# Patient Record
Sex: Male | Born: 1970 | Race: Black or African American | Hispanic: No | Marital: Married | Smoking: Former smoker
Health system: Southern US, Community
[De-identification: ages and names within clinical notes are randomized; demographics above are authoritative.]

## PROBLEM LIST (undated history)

## (undated) DIAGNOSIS — M65311 Trigger thumb, right thumb: Secondary | ICD-10-CM

## (undated) DIAGNOSIS — E785 Hyperlipidemia, unspecified: Secondary | ICD-10-CM

## (undated) DIAGNOSIS — R519 Headache, unspecified: Secondary | ICD-10-CM

## (undated) DIAGNOSIS — M79641 Pain in right hand: Secondary | ICD-10-CM

## (undated) DIAGNOSIS — J45909 Unspecified asthma, uncomplicated: Secondary | ICD-10-CM

## (undated) DIAGNOSIS — K219 Gastro-esophageal reflux disease without esophagitis: Secondary | ICD-10-CM

## (undated) DIAGNOSIS — E7800 Pure hypercholesterolemia, unspecified: Secondary | ICD-10-CM

## (undated) DIAGNOSIS — M109 Gout, unspecified: Secondary | ICD-10-CM

## (undated) HISTORY — DX: Gout, unspecified: M10.9

## (undated) HISTORY — DX: Unspecified asthma, uncomplicated: J45.909

## (undated) HISTORY — DX: Trigger thumb, right thumb: M65.311

## (undated) HISTORY — DX: Pure hypercholesterolemia, unspecified: E78.00

## (undated) HISTORY — DX: Headache, unspecified: R51.9

## (undated) HISTORY — DX: Pain in right hand: M79.641

## (undated) HISTORY — DX: Gastro-esophageal reflux disease without esophagitis: K21.9

---

## 1998-12-14 ENCOUNTER — Ambulatory Visit: Admission: RE | Admit: 1998-12-14 | Discharge: 1998-12-14 | Payer: Self-pay | Admitting: Family Medicine

## 1999-03-31 ENCOUNTER — Emergency Department (HOSPITAL_COMMUNITY): Admission: EM | Admit: 1999-03-31 | Discharge: 1999-03-31 | Payer: Self-pay | Admitting: Emergency Medicine

## 1999-04-07 ENCOUNTER — Emergency Department (HOSPITAL_COMMUNITY): Admission: EM | Admit: 1999-04-07 | Discharge: 1999-04-07 | Payer: Self-pay | Admitting: Emergency Medicine

## 2004-07-31 ENCOUNTER — Ambulatory Visit (HOSPITAL_COMMUNITY): Admission: RE | Admit: 2004-07-31 | Discharge: 2004-07-31 | Payer: Self-pay | Admitting: Family Medicine

## 2005-03-20 ENCOUNTER — Encounter: Admission: RE | Admit: 2005-03-20 | Discharge: 2005-03-20 | Payer: Self-pay | Admitting: Family Medicine

## 2006-12-01 HISTORY — PX: BACK SURGERY: SHX140

## 2008-01-04 ENCOUNTER — Emergency Department (HOSPITAL_COMMUNITY): Admission: EM | Admit: 2008-01-04 | Discharge: 2008-01-04 | Payer: Self-pay | Admitting: Emergency Medicine

## 2008-09-26 ENCOUNTER — Encounter: Admission: RE | Admit: 2008-09-26 | Discharge: 2008-09-26 | Payer: Self-pay | Admitting: Sports Medicine

## 2008-10-01 HISTORY — PX: BACK SURGERY: SHX140

## 2008-10-10 ENCOUNTER — Ambulatory Visit (HOSPITAL_COMMUNITY): Admission: RE | Admit: 2008-10-10 | Discharge: 2008-10-11 | Payer: Self-pay | Admitting: Neurosurgery

## 2010-02-13 ENCOUNTER — Emergency Department (HOSPITAL_COMMUNITY): Admission: EM | Admit: 2010-02-13 | Discharge: 2010-02-13 | Payer: Self-pay | Admitting: Emergency Medicine

## 2010-02-27 ENCOUNTER — Encounter: Admission: RE | Admit: 2010-02-27 | Discharge: 2010-02-27 | Payer: Self-pay | Admitting: Otolaryngology

## 2010-12-22 ENCOUNTER — Encounter: Payer: Self-pay | Admitting: Sports Medicine

## 2011-04-15 NOTE — Op Note (Signed)
NAME:  Lee Reynolds, Lee Reynolds                ACCOUNT NO.:  [id number]   MEDICAL RECORD NO.:  [id number]          PATIENT TYPE:  AMB   LOCATION:  SDS                          FACILITY:  [facility]   PHYSICIAN:  Clydene Fake, M.D.  DATE OF BIRTH:  1971/08/13   DATE OF PROCEDURE:  10/10/2008  DATE OF DISCHARGE:                               OPERATIVE REPORT   PREOPERATIVE DIAGNOSES:  Lumbar stenosis, spondylosis, herniated nucleus  pulposus with radiculopathy, L4-L5.   POSTOPERATIVE DIAGNOSES:  Lumbar stenosis, spondylosis, herniated  nucleus pulposus with radiculopathy, L4-L5.   PROCEDURE:  Left L4-L5 decompressive laminectomy and diskectomy,  microdissection with microscope.   SURGEON:  Clydene Fake, MD   ASSISTANT:  Cristi Loron, MD   ANESTHESIA:  General endotracheal tube anesthesia.   ESTIMATED BLOOD LOSS:  Minimal.   BLOOD GIVEN:  None.   DRAINS:  None.   COMPLICATIONS:  None.   REASON FOR PROCEDURE:  The patient is a 40 year old gentleman who has  had back and left leg pain, numbness, urgency of bladder function but no  incontinence, some decreased sensation left S1 distribution, and  decreased left ankle reflex.  An MRI was done showing extremely large  central disk herniation at L4-L5.  He had older components with new free  fragment on top of that causing severe stenosis of the canal.  It is bit  worst to the left than it is to the right.  The patient was brought in  for decompressive laminectomy.   PROCEDURE IN DETAIL:  The patient was brought to the operating room and  general anesthesia was induced.  The patient was placed in a prone  position on a Wilson frame with all pressure points padded.  The patient  was prepped and draped in sterile fashion.  The site of incision  injected with 20 mL of 1% lidocaine with epinephrine.  An incision was  then made in midline, lower lumbar spine.  Incision was taken down to  the fascia.  A needle was placed at the interspace  through the fascia.  X-rays were obtained showing the needle was pointing at the L4-L5  interspace.  Fascia was incised on the left side and subperiosteal  dissection was done with L4 and L5 spinous process lamina out to the  facet.  Self-retaining retractor was placed.  Another marker was placed  here at the interspace.  Another x-ray was obtained confirming our  positioning at L4-L5 level.  Microscope was brought in for  microdissection.  At this point, high-speed drill was used to start semi-  hemilaminectomy and needle facetectomy completed with Kerrison punches.  Ligamentum flavum was removed with Kerrison punches and foraminotomy  done over the L5 root.  We explored the epidural space and found  extremely large disk herniation compressing the nerve root and dura.  We  carefully used a nerve hook and found some free fragments and pulled  these out using nerve root retractor to retract the nerve root and dura  medially and found significant bunch of fragments of disk.  It was  continued removing those.  There was still a large subligamentous disk  herniation component.  The disk space was incised and diskectomy done  with pituitary rongeurs and curettes.  We used osteophyte tools and also  at the endplates.  When we were finished, we had good decompression of  the central canal and the L4 and L5 nerve roots.  Hemostasis was  obtained with bipolar cauterization and Gelfoam and thrombin.  Gelfoam  was irrigated out.  We irrigated with antibiotic solution.  We had very  good hemostasis.  Retractors were removed.  The fascia was closed with 0  Vicryl interrupted sutures.  Subcutaneous tissue closed with 2-0 and 3-0  Vicryl interrupted sutures.  Skin closed with benzoin and Steri-Strips.  A dressing was placed.  The patient was placed back in a supine  position, awoken from anesthesia, and transferred to recovery in stable  condition.           ______________________________  Clydene Fake, M.D.     JRH/MEDQ  D:  10/10/2008  T:  10/11/2008  Job:  [id number]

## 2011-09-02 LAB — APTT: aPTT: 32

## 2011-09-02 LAB — BASIC METABOLIC PANEL
BUN: 15
GFR calc Af Amer: >60
Potassium: 4.4
Sodium: 140

## 2011-09-02 LAB — PROTIME-INR
INR: 0.9
Prothrombin Time: 12.5

## 2011-09-02 LAB — URINALYSIS, ROUTINE W REFLEX MICROSCOPIC
Hgb urine dipstick: NEGATIVE
pH: 5.5

## 2011-09-02 LAB — CBC
Hemoglobin: 15.6
MCHC: 32.6
RBC: 5.53

## 2012-09-23 ENCOUNTER — Emergency Department (INDEPENDENT_AMBULATORY_CARE_PROVIDER_SITE_OTHER)
Admission: EM | Admit: 2012-09-23 | Discharge: 2012-09-23 | Disposition: A | Discharge status: Home / Self Care | Payer: BC Managed Care – PPO | Source: Home / Self Care | Attending: Family Medicine | Admitting: Family Medicine

## 2012-09-23 ENCOUNTER — Encounter (HOSPITAL_COMMUNITY): Payer: Self-pay

## 2012-09-23 DIAGNOSIS — M62830 Muscle spasm of back: Secondary | ICD-10-CM

## 2012-09-23 DIAGNOSIS — M5380 Other specified dorsopathies, site unspecified: Secondary | ICD-10-CM

## 2012-09-23 MED ORDER — CYCLOBENZAPRINE HCL 10 MG PO TABS: 10.0000 mg | ORAL_TABLET | Freq: Two times a day (BID) | ORAL | Status: DC | PRN

## 2012-09-23 MED ORDER — HYDROCODONE-ACETAMINOPHEN 5-325 MG PO TABS: 1.0000 | ORAL_TABLET | Freq: Once | ORAL | Status: DC

## 2012-09-23 MED ORDER — IBUPROFEN 600 MG PO TABS
600.0000 mg | ORAL_TABLET | Freq: Three times a day (TID) | ORAL | Status: DC | PRN
Start: 2012-09-23 — End: 2018-10-01

## 2012-09-23 NOTE — ED Provider Notes (Signed)
History     CSN: [id number]  Arrival date & time 09/23/12  1212   First MD Initiated Contact with Patient 09/23/12 1223      Chief Complaint  Patient presents with  . Back Pain    (Consider location/radiation/quality/duration/timing/severity/associated sxs/prior treatment) HPI Comments: 41 year old male with history of morbid obesity and chronic back pain status post decompression laminectomy of L4 -L5 in 2011. Here complaining of pain in his low back bilateral but worse in the right side since waking up this morning. Denies radiation of the pain to the lower extremities. Denies weakness, paresthesias or numbness in the lower extremities. Patient report he started recently a new exercise routine where he is doing a static bike and lifting weight as well as states he walks. Denies dysuria or hematuria. Denies abdominal pain.   History reviewed. No pertinent past medical history.  Past Surgical History  Procedure Date  . Back surgery 2008    L4,L5    No family history on file.  History  Substance Use Topics  . Smoking status: Not on file  . Smokeless tobacco: Not on file  . Alcohol Use:       Review of Systems  Constitutional: Negative for fever, chills, diaphoresis and fatigue.  Cardiovascular: Negative for leg swelling.  Gastrointestinal: Negative for nausea, abdominal pain, diarrhea and constipation.  Genitourinary: Negative for dysuria and hematuria.  Musculoskeletal: Positive for back pain. Negative for joint swelling.  Neurological: Negative for dizziness, weakness, numbness and headaches.  All other systems reviewed and are negative.    Allergies  Review of patient's allergies indicates no known allergies.  Home Medications   Current Outpatient Rx  Name Route Sig Dispense Refill  . CYCLOBENZAPRINE HCL 10 MG PO TABS Oral Take 1 tablet (10 mg total) by mouth 2 (two) times daily as needed for muscle spasms. 20 tablet 0  . IBUPROFEN 600 MG PO TABS Oral Take 1  tablet (600 mg total) by mouth every 8 (eight) hours as needed for pain. 20 tablet 0    There were no vitals taken for this visit.  Physical Exam  Nursing note and vitals reviewed. Constitutional: He is oriented to person, place, and time. He appears well-developed and well-nourished. No distress.  HENT:  Head: Normocephalic and atraumatic.  Cardiovascular: Normal heart sounds.   Pulmonary/Chest: Breath sounds normal.  Abdominal: Soft. There is no tenderness.       obese  Musculoskeletal:       Spine central: No obvious scoliosis or kyphosis. No pain over bone processes in entire spine.  Vertical small well healed incision in cental lumbar area. Able to bend forward (flexion) 45 degrees and posteriorly (extension) 10 degrees.  Tenderness to palpation and increased tone of lumbar paravertebral muscles bilaterally.  Negative straight leg test ipsy and contralateral.  Lower extremities neurovascularly intact.   Neurological: He is alert and oriented to person, place, and time.  Skin: No rash noted.    ED Course  Procedures (including critical care time)  Labs Reviewed - No data to display No results found.   1. Muscle spasm of back       MDM  Treated with Flexeril and ibuprofen. Back exercises discussed with patient and provided in writing. Asked to followup with primary care provider or his spine specialist if persistent or worsening symptoms despite following treatment.        Sharin Grave, MD 09/24/12 1145

## 2012-09-23 NOTE — ED Notes (Signed)
C/o back pain and spasms, pain started this morning

## 2014-09-26 ENCOUNTER — Other Ambulatory Visit: Payer: Self-pay | Admitting: Rehabilitation

## 2014-09-26 DIAGNOSIS — M5416 Radiculopathy, lumbar region: Secondary | ICD-10-CM

## 2014-10-04 ENCOUNTER — Other Ambulatory Visit: Payer: BC Managed Care – PPO

## 2014-10-11 ENCOUNTER — Ambulatory Visit
Admission: RE | Admit: 2014-10-11 | Discharge: 2014-10-11 | Disposition: A | Discharge status: Home / Self Care | Payer: BC Managed Care – PPO | Source: Ambulatory Visit | Attending: Rehabilitation | Admitting: Rehabilitation

## 2014-10-11 DIAGNOSIS — M5416 Radiculopathy, lumbar region: Secondary | ICD-10-CM

## 2014-10-11 MED ORDER — GADOBENATE DIMEGLUMINE 529 MG/ML IV SOLN
20.0000 mL | Freq: Once | INTRAVENOUS | Status: AC | PRN
  Administered 2014-10-11: 20 mL via INTRAVENOUS

## 2018-06-24 DIAGNOSIS — M79641 Pain in right hand: Secondary | ICD-10-CM

## 2018-06-24 DIAGNOSIS — M65311 Trigger thumb, right thumb: Secondary | ICD-10-CM

## 2018-06-24 HISTORY — DX: Pain in right hand: M79.641

## 2018-06-24 HISTORY — DX: Trigger thumb, right thumb: M65.311

## 2018-09-02 ENCOUNTER — Encounter (HOSPITAL_COMMUNITY): Payer: Self-pay | Admitting: Emergency Medicine

## 2018-09-02 ENCOUNTER — Ambulatory Visit (HOSPITAL_COMMUNITY)
Admission: EM | Admit: 2018-09-02 | Discharge: 2018-09-02 | Disposition: A | Discharge status: Home / Self Care | Payer: No Typology Code available for payment source | Attending: Family Medicine | Admitting: Family Medicine

## 2018-09-02 DIAGNOSIS — R0789 Other chest pain: Secondary | ICD-10-CM | POA: Diagnosis not present

## 2018-09-02 NOTE — Discharge Instructions (Addendum)
I believe that your chest pain was related to acid reflux Your EKG was normal Your exam was normal, heart and lungs sound normal You are feeling better today which is good.  If you develop the pain again with worse signs to include, nausea, sweating, dizziness, fatigue or SOB please go to the ER.

## 2018-09-02 NOTE — ED Triage Notes (Signed)
Pt sts mid sternal CP starting yesterday worse with movement

## 2018-09-02 NOTE — ED Provider Notes (Signed)
[facility]    CSN: [id number] Arrival date & time: 09/02/18  1205     History   Chief Complaint Chief Complaint  Patient presents with  . Chest Pain    HPI Lee Reynolds is a 47 y.o. male.   Pt is a 47 year old male with no significant PMH. He presents with central/epigastric pain that started a few hours after eating some sausage yesterday morning. The pain decreased throughout the night and he was able to sleep. No discomfort today. He took Pepcid yesterday for symptoms with some relief. He does have GERD from time to time. He denies any associated SOB, nausea, diaphoresis. The pain did not radiate. He has not had any recent URI symptoms. No recent travel. No hx of heart disease, PE, DVT. No family hx or ACS. No fever, chills, body aches, fatigue. He does not smoke.   ROS per HPI      History reviewed. No pertinent past medical history.  There are no active problems to display for this patient.   Past Surgical History:  Procedure Laterality Date  . BACK SURGERY  2008   L4,L5       Home Medications    Prior to Admission medications   Medication Sig Start Date End Date Taking? Authorizing Provider  cyclobenzaprine (FLEXERIL) 10 MG tablet Take 1 tablet (10 mg total) by mouth 2 (two) times daily as needed for muscle spasms. Patient not taking: Reported on 09/02/2018 09/23/12   Moreno-Coll, Adlih, MD  ibuprofen (ADVIL,MOTRIN) 600 MG tablet Take 1 tablet (600 mg total) by mouth every 8 (eight) hours as needed for pain. 09/23/12   Moreno-Coll, Adlih, MD    Family History History reviewed. No pertinent family history.  Social History Social History   Tobacco Use  . Smoking status: Not on file  Substance Use Topics  . Alcohol use: Not on file  . Drug use: Not on file     Allergies   Patient has no known allergies.   Review of Systems Review of Systems   Physical Exam Triage Vital Signs ED Triage Vitals [09/02/18 1229]  Enc Vitals Group       BP 126/74     Pulse Rate 69     Resp 16     Temp 98.1 F (36.7 C)     Temp Source Oral     SpO2 100 %     Weight      Height      Head Circumference      Peak Flow      Pain Score      Pain Loc      Pain Edu?      Excl. in GC?    No data found.  Updated Vital Signs BP 126/74 (BP Location: Left Arm)   Pulse 69   Temp 98.1 F (36.7 C) (Oral)   Resp 16   SpO2 100%   Visual Acuity Right Eye Distance:   Left Eye Distance:   Bilateral Distance:    Right Eye Near:   Left Eye Near:    Bilateral Near:     Physical Exam  Constitutional: He is oriented to person, place, and time. He appears well-developed and well-nourished.  Very pleasant. Non toxic or ill appearing.     HENT:  Head: Normocephalic and atraumatic.  Neck: Normal range of motion.  Cardiovascular: Normal rate, regular rhythm and normal pulses. Exam reveals no S3, no S4 and no distant heart sounds.  Pulmonary/Chest: Effort normal and breath sounds normal.  Lungs clear in all fields. No dyspnea or distress. No retractions or nasal flaring.     Abdominal: Soft. There is no tenderness.  Musculoskeletal: Normal range of motion.  Neurological: He is alert and oriented to person, place, and time.  Skin: Skin is warm and dry.  Psychiatric: He has a normal mood and affect.  Nursing note and vitals reviewed.    UC Treatments / Results  Labs (all labs ordered are listed, but only abnormal results are displayed) Labs Reviewed - No data to display  EKG None  Radiology No results found.  Procedures Procedures (including critical care time)  Medications Ordered in UC Medications - No data to display  Initial Impression / Assessment and Plan / UC Course  I have reviewed the triage vital signs and the nursing notes.  Pertinent labs & imaging results that were available during my care of the patient were reviewed by me and considered in my medical decision making (see chart for details).     Exam  normal Pt is not currently having chest pain and feels better No other concerning risk factors, signs or symptoms Less likely ACS, PE.  EKG normal sinus rhythm and normal rate.  More likely this is GERD related. Instructed to follow up for continued or worsening symptoms.  If he develops chest pain again and has concerning symptoms with it he needs to go to the ER.  Final Clinical Impressions(s) / UC Diagnoses   Final diagnoses:  Other chest pain     Discharge Instructions     I believe that your chest pain was related to acid reflux Your EKG was normal Your exam was normal, heart and lungs sound normal You are feeling better today which is good.  If you develop the pain again with worse signs to include, nausea, sweating, dizziness, fatigue or SOB please go to the ER.     ED Prescriptions    None     Controlled Substance Prescriptions [place] Controlled Substance Registry consulted? no   Janace Aris, NP 09/02/18 1330

## 2018-10-01 ENCOUNTER — Ambulatory Visit (HOSPITAL_COMMUNITY)
Admission: EM | Admit: 2018-10-01 | Discharge: 2018-10-01 | Disposition: A | Discharge status: Home / Self Care | Payer: No Typology Code available for payment source | Attending: Family Medicine | Admitting: Family Medicine

## 2018-10-01 ENCOUNTER — Other Ambulatory Visit: Payer: Self-pay

## 2018-10-01 ENCOUNTER — Ambulatory Visit (INDEPENDENT_AMBULATORY_CARE_PROVIDER_SITE_OTHER): Payer: No Typology Code available for payment source

## 2018-10-01 ENCOUNTER — Encounter (HOSPITAL_COMMUNITY): Payer: Self-pay | Admitting: Emergency Medicine

## 2018-10-01 DIAGNOSIS — R0789 Other chest pain: Secondary | ICD-10-CM | POA: Diagnosis not present

## 2018-10-01 DIAGNOSIS — K219 Gastro-esophageal reflux disease without esophagitis: Secondary | ICD-10-CM | POA: Diagnosis not present

## 2018-10-01 HISTORY — DX: Hyperlipidemia, unspecified: E78.5

## 2018-10-01 MED ORDER — LIDOCAINE VISCOUS HCL 2 % MT SOLN
15.0000 mL | Freq: Once | OROMUCOSAL | Status: AC
  Administered 2018-10-01: 15 mL via ORAL

## 2018-10-01 MED ORDER — ALUM & MAG HYDROXIDE-SIMETH 200-200-20 MG/5ML PO SUSP
ORAL | Status: AC
Start: 2018-10-01 — End: 2018-10-01
  Filled 2018-10-01: qty 30

## 2018-10-01 MED ORDER — LIDOCAINE VISCOUS HCL 2 % MT SOLN
OROMUCOSAL | Status: AC
  Filled 2018-10-01: qty 15

## 2018-10-01 MED ORDER — ALUM & MAG HYDROXIDE-SIMETH 200-200-20 MG/5ML PO SUSP
30.0000 mL | Freq: Once | ORAL | Status: AC
  Administered 2018-10-01: 30 mL via ORAL

## 2018-10-01 NOTE — Discharge Instructions (Signed)
I believe your chest pain is from your stomach and esophagus. Take the Prilosec twice a day.  When the pain improves she may go back to once a day. Avoid aspirin, ibuprofen type medicines for the time being.  You may take Tylenol as needed for pain. Follow-up with your family doctor.

## 2018-10-01 NOTE — ED Triage Notes (Signed)
Pt reports continued central chest pressure started over a month ago. Pt was seen here 10/3 with normal EKG. Pt prescribed Prilosec by PCP and taking as prescribed. New onset left arm pain yesterday in upper arm that subsided this morning. Denies associated symptoms. Triage note done by [facility] Student RN

## 2018-10-01 NOTE — ED Provider Notes (Signed)
[facility]    CSN: [id number] Arrival date & time: 10/01/18  0803     History   Chief Complaint Chief Complaint  Patient presents with  . Chest Pain    HPI Lee Reynolds is a 47 y.o. male.   HPI  Patient is here for chest pain.  This is a follow-up from his visit of 09/02/2018.  At that time it was thought his chest pain was more gastrointestinal.  He was referred back to his primary care doctor.  He did see his doctor little over week ago.  She thinks he has acid reflux.  She started him on Prilosec 20 mg once a day.  He has been taking this medication but has not seen improvement.  He has a chest pressure sensation is there all the time.  It does wax and wane somewhat but never completely goes away.  Since yesterday, he has periods of increased pain that radiated into his left upper arm.  With his new symptoms, he wanted further evaluation.  Original EKG was negative.  He has not had any lab work.  Cardiac risk factors include hyperlipidemia, obesity/sedentary.  Negative cigarette smoking, negative hypertension, negative family history. Chest pain is nonexertional.  Chest pain does radiate to left arm.  He has no diaphoresis or shortness of breath.  No underlying lung disease.  No recent cough or infection. Patient denies feeling more stressed.  Past Medical History:  Diagnosis Date  . Hyperlipemia   Gout Presumed GERD  There are no active problems to display for this patient.   Past Surgical History:  Procedure Laterality Date  . BACK SURGERY  2008   L4,L5       Home Medications    Prior to Admission medications   Medication Sig Start Date End Date Taking? Authorizing Provider  allopurinol (ZYLOPRIM) 300 MG tablet TAKE 1 TABLET BY MOUTH ONCE DAILY FOR 90 DAYS 07/15/18  Yes [provider]  atorvastatin (LIPITOR) 20 MG tablet atorvastatin 20 mg tablet  take 1 tablet by mouth once daily   Yes [provider]  omeprazole (PRILOSEC) 20 MG  capsule Take 20 mg by mouth daily. 09/22/18  Yes [provider]    Family History Family History  Problem Relation Age of Onset  . Hyperlipidemia Mother   . Cancer Father     Social History Social History   Tobacco Use  . Smoking status: Current Some Day Smoker    Types: Cigars  . Smokeless tobacco: Never Used  Substance Use Topics  . Alcohol use: Never    Frequency: Never  . Drug use: Never     Allergies   Patient has no known allergies.   Review of Systems Review of Systems  Constitutional: Negative for activity change, appetite change, chills and fever.  HENT: Negative for congestion, dental problem, ear pain and sore throat.   Eyes: Negative for pain and visual disturbance.  Respiratory: Negative for cough and shortness of breath.   Cardiovascular: Positive for chest pain. Negative for palpitations.  Gastrointestinal: Negative for abdominal pain and vomiting.  Genitourinary: Negative for dysuria and hematuria.  Musculoskeletal: Negative for arthralgias and back pain.  Skin: Negative for color change and rash.  Neurological: Negative for seizures and syncope.  Psychiatric/Behavioral: Negative for sleep disturbance. The patient is nervous/anxious.        Chest pain makes him feel mildly anxious  All other systems reviewed and are negative.    Physical Exam Triage Vital Signs  ED Triage Vitals [10/01/18 0828]  Enc Vitals Group     BP (!) 133/91     Pulse Rate 72     Resp 16     Temp 98 F (36.7 C)     Temp Source Oral     SpO2 99 %     Weight      Height      Head Circumference      Peak Flow      Pain Score 1     Pain Loc      Pain Edu?      Excl. in GC?    No data found.  Updated Vital Signs BP 127/85 (BP Location: Left Arm)   Pulse 63   Temp 97.9 F (36.6 C) (Oral)   Resp 18   SpO2 100%      Physical Exam  Constitutional: He appears well-developed and well-nourished. He does not appear ill. No distress.  HENT:  Head:  Normocephalic and atraumatic.  Mouth/Throat: Oropharynx is clear and moist.  Eyes: Pupils are equal, round, and reactive to light. Conjunctivae are normal.  Neck: Normal range of motion. No JVD present.  Cardiovascular: Normal rate and regular rhythm.  No extrasystoles are present. Exam reveals distant heart sounds. Exam reveals no gallop.  No murmur heard. Pulmonary/Chest: Effort normal and breath sounds normal. No respiratory distress.  Abdominal: Soft. Bowel sounds are normal. He exhibits no distension.  No organomegaly.  No epigastric tenderness.  Musculoskeletal: Normal range of motion. He exhibits no edema.       Right lower leg: He exhibits no edema.       Left lower leg: He exhibits no edema.  Lymphadenopathy:    He has no cervical adenopathy.  Neurological: He is alert.  Skin: Skin is warm and dry.  Psychiatric: He has a normal mood and affect. His behavior is normal.     UC Treatments / Results  Labs (all labs ordered are listed, but only abnormal results are displayed) Labs Reviewed - No data to display  EKG None  Radiology Dg Chest 2 View  Result Date: 10/01/2018 CLINICAL DATA:  Chest pain.  Hypertension. EXAM: CHEST - 2 VIEW COMPARISON:  None. FINDINGS: There is atelectatic change in the inferior lingula. Lungs elsewhere clear. Heart size and pulmonary vascularity are normal. No adenopathy. No pneumothorax. No bone lesions. IMPRESSION: Inferior lingular atelectasis. Lungs elsewhere clear. Heart size normal. No adenopathy. Electronically Signed   By: Bretta Bang III M.D.   On: 10/01/2018 09:14    Procedures Procedures (including critical care time)  Medications Ordered in UC Medications  alum & mag hydroxide-simeth (MAALOX/MYLANTA) 200-200-20 MG/5ML suspension 30 mL (30 mLs Oral Given 10/01/18 0929)    And  lidocaine (XYLOCAINE) 2 % viscous mouth solution 15 mL (15 mLs Oral Given 10/01/18 0929)    Initial Impression / Assessment and Plan / UC Course  I  have reviewed the triage vital signs and the nursing notes.  Pertinent labs & imaging results that were available during my care of the patient were reviewed by me and considered in my medical decision making (see chart for details).    Patient got good relief of his chest discomfort from the GI cocktail.  His chest x-ray and EKG are normal.  We discussed that he probably does have acid reflux causing his symptoms.  Since the Prilosec once a day is not helping, he is to increase it to twice a day until symptoms improve and then  go back down to once a day.  He does need to follow-up with his primary care doctor.  He is reminded that if he has acute chest pain, he needs to go to the ER Final Clinical Impressions(s) / UC Diagnoses   Final diagnoses:  Gastroesophageal reflux disease, esophagitis presence not specified  Atypical chest pain     Discharge Instructions     I believe your chest pain is from your stomach and esophagus. Take the Prilosec twice a day.  When the pain improves she may go back to once a day. Avoid aspirin, ibuprofen type medicines for the time being.  You may take Tylenol as needed for pain. Follow-up with your family doctor.    ED Prescriptions    None     Controlled Substance Prescriptions [place] Controlled Substance Registry consulted? No   Eustace Moore, MD 10/01/18 1105

## 2018-10-11 ENCOUNTER — Encounter: Payer: Self-pay | Admitting: Cardiology

## 2018-10-11 ENCOUNTER — Ambulatory Visit (INDEPENDENT_AMBULATORY_CARE_PROVIDER_SITE_OTHER): Payer: No Typology Code available for payment source | Admitting: Cardiology

## 2018-10-11 VITALS — BP 126/70 | HR 74 | Ht 74.0 in | Wt 311.0 lb

## 2018-10-11 DIAGNOSIS — M109 Gout, unspecified: Secondary | ICD-10-CM

## 2018-10-11 DIAGNOSIS — K219 Gastro-esophageal reflux disease without esophagitis: Secondary | ICD-10-CM | POA: Diagnosis not present

## 2018-10-11 DIAGNOSIS — J45909 Unspecified asthma, uncomplicated: Secondary | ICD-10-CM | POA: Diagnosis not present

## 2018-10-11 DIAGNOSIS — E7800 Pure hypercholesterolemia, unspecified: Secondary | ICD-10-CM

## 2018-10-11 DIAGNOSIS — R079 Chest pain, unspecified: Secondary | ICD-10-CM | POA: Diagnosis not present

## 2018-10-11 HISTORY — DX: Pure hypercholesterolemia, unspecified: E78.00

## 2018-10-11 HISTORY — DX: Gout, unspecified: M10.9

## 2018-10-11 HISTORY — DX: Gastro-esophageal reflux disease without esophagitis: K21.9

## 2018-10-11 HISTORY — DX: Unspecified asthma, uncomplicated: J45.909

## 2018-10-11 NOTE — Progress Notes (Signed)
Cardiology Office Note:    Date:  10/11/2018   ID:  Margaretha Seeds, DOB 02-Jan-1971, MRN [id number]  PCP:  Shaune Pollack, MD  Cardiologist:  Garwin Brothers, MD   Referring MD: Shaune Pollack, MD    ASSESSMENT:    1. Chest pain, unspecified type   2. Asthma, unspecified asthma severity, unspecified whether complicated, unspecified whether persistent   3. Gout, unspecified cause, unspecified chronicity, unspecified site   4. Gastroesophageal reflux disease, esophagitis presence not specified   5. Hypercholesterolemia    PLAN:    In order of problems listed above:  1. Primary prevention stressed with the patient.  Importance of compliance with diet and medication stressed and he vocalized understanding.  His blood pressure is stable.  Diet was discussed for dyslipidemia and diabetes mellitus and weight reduction was stressed vocalized understanding.  The risks of obesity explained to the patient. 2. Patient is significantly concerned about his symptoms and wants to begin an exercise program.  In view of multiple risk factors he will undergo exercise stress Cardiolite.  Echocardiogram will be done to assess murmur heard in auscultation.  He knows to go to the nearest emergency room for any significant concerns. 3. Patient will be seen in follow-up appointment in 6 months or earlier if the patient has any concerns    Medication Adjustments/Labs and Tests Ordered: Current medicines are reviewed at length with the patient today.  Concerns regarding medicines are outlined above.  Orders Placed This Encounter  Procedures  . MYOCARDIAL PERFUSION IMAGING  . ECHOCARDIOGRAM COMPLETE   No orders of the defined types were placed in this encounter.    History of Present Illness:    GALEN RUSSMAN is a 47 y.o. male who is being seen today for the evaluation of chest pain at the request of Shaune Pollack, MD.  Patient is a pleasant 47 year old male.  He has past medical history of dyslipidemia  and morbid obesity.  He leads a sedentary lifestyle.  He mentions to me that he is being having substernal chest discomfort not related to exertion.  He went to the emergency room and was evaluated and discharged.  Subsequently is leading a sedentary lifestyle and is very busy with his work.  He wants his own business.  He denies any radiation of the symptoms to the neck or to the arms.  Sexual activity does not bring about the symptoms.  Past Medical History:  Diagnosis Date  . Asthma 10/11/2018  . GERD (gastroesophageal reflux disease) 10/11/2018  . Gout 10/11/2018  . Hypercholesterolemia 10/11/2018  . Hyperlipemia   . Right hand pain 06/24/2018  . Trigger finger of right thumb 06/24/2018    Past Surgical History:  Procedure Laterality Date  . BACK SURGERY  2008   L4,L5    Current Medications: Current Meds  Medication Sig  . Albuterol Sulfate 108 (90 Base) MCG/ACT AEPB Inhale 1 puff into the lungs as needed.  Marland Kitchen allopurinol (ZYLOPRIM) 300 MG tablet TAKE 1 TABLET BY MOUTH ONCE DAILY FOR 90 DAYS  . atorvastatin (LIPITOR) 20 MG tablet atorvastatin 20 mg tablet  take 1 tablet by mouth once daily  . cyclobenzaprine (FLEXERIL) 10 MG tablet Take 1 tablet by mouth 3 (three) times daily as needed.  . indomethacin (INDOCIN) 25 MG capsule Take 1 capsule by mouth 2 (two) times daily.  Marland Kitchen loratadine (CLARITIN) 10 MG tablet Take 10 mg by mouth daily.  Marland Kitchen omeprazole (PRILOSEC) 20 MG capsule Take 20 mg by mouth daily.  Allergies:   Patient has no known allergies.   Social History   Socioeconomic History  . Marital status: Married    Spouse name: Not on file  . Number of children: Not on file  . Years of education: Not on file  . Highest education level: Not on file  Occupational History  . Not on file  Social Needs  . Financial resource strain: Not on file  . Food insecurity:    Worry: Not on file    Inability: Not on file  . Transportation needs:    Medical: Not on file     Non-medical: Not on file  Tobacco Use  . Smoking status: Current Some Day Smoker    Types: Cigars  . Smokeless tobacco: Never Used  Substance and Sexual Activity  . Alcohol use: Never    Frequency: Never  . Drug use: Never  . Sexual activity: Not on file  Lifestyle  . Physical activity:    Days per week: Not on file    Minutes per session: Not on file  . Stress: Not on file  Relationships  . Social connections:    Talks on phone: Not on file    Gets together: Not on file    Attends religious service: Not on file    Active member of club or organization: Not on file    Attends meetings of clubs or organizations: Not on file    Relationship status: Not on file  Other Topics Concern  . Not on file  Social History Narrative  . Not on file     Family History: The patient's family history includes Cancer in his father; Hyperlipidemia in his mother.  ROS:   Please see the history of present illness.    All other systems reviewed and are negative.  EKGs/Labs/Other Studies Reviewed:    The following studies were reviewed today: EKG that he has brought along with him reveals sinus rhythm and nonspecific ST-T changes.   Recent Labs: No results found for requested labs within last 8760 hours.  Recent Lipid Panel No results found for: CHOL, TRIG, HDL, CHOLHDL, VLDL, LDLCALC, LDLDIRECT  Physical Exam:    VS:  BP 126/70 (BP Location: Right Arm, Patient Position: Sitting, Cuff Size: Normal)   Pulse 74   Ht 6\' 2"  (1.88 m)   Wt (!) 311 lb (141.1 kg)   SpO2 98%   BMI 39.93 kg/m     Wt Readings from Last 3 Encounters:  10/11/18 (!) 311 lb (141.1 kg)  10/11/14 (!) 310 lb (140.6 kg)     GEN: Patient is in no acute distress HEENT: Normal NECK: No JVD; No carotid bruits LYMPHATICS: No lymphadenopathy CARDIAC: S1 S2 regular, 2/6 systolic murmur at the apex. RESPIRATORY:  Clear to auscultation without rales, wheezing or rhonchi  ABDOMEN: Soft, non-tender,  non-distended MUSCULOSKELETAL:  No edema; No deformity  SKIN: Warm and dry NEUROLOGIC:  Alert and oriented x 3 PSYCHIATRIC:  Normal affect    Signed, Garwin Brothers, MD  10/11/2018 11:40 AM    [facility]

## 2018-10-11 NOTE — Patient Instructions (Signed)
Medication Instructions:  Your physician recommends that you continue on your current medications as directed. Please refer to the Current Medication list given to you today.  If you need a refill on your cardiac medications before your next appointment, please call your pharmacy.   Lab work: None  If you have labs (blood work) drawn today and your tests are completely normal, you will receive your results only by: Marland Kitchen MyChart Message (if you have MyChart) OR . A paper copy in the mail If you have any lab test that is abnormal or we need to change your treatment, we will call you to review the results.  Testing/Procedures: Your physician has requested that you have an echocardiogram. Echocardiography is a painless test that uses sound waves to create images of your heart. It provides your doctor with information about the size and shape of your heart and how well your heart's chambers and valves are working. This procedure takes approximately one hour. There are no restrictions for this procedure.  Your physician has requested that you have en exercise stress myoview. For further information please visit [email or web address] Please follow instruction sheet, as given.  Follow-Up: At [facility], you and your health needs are our priority.  As part of our continuing mission to provide you with exceptional heart care, we have created designated Provider Care Teams.  These Care Teams include your primary Cardiologist (physician) and Advanced Practice Providers (APPs -  Physician Assistants and Nurse Practitioners) who all work together to provide you with the care you need, when you need it.  You will need a follow up appointment in 6 months.  Please call our office 2 months in advance to schedule this appointment.  You may see another member of our [employer] Provider Team in [place]: Gypsy Balsam, MD . Norman Herrlich, MD  Any Other Special Instructions Will Be Listed Below (If  Applicable).

## 2018-10-18 ENCOUNTER — Ambulatory Visit (HOSPITAL_BASED_OUTPATIENT_CLINIC_OR_DEPARTMENT_OTHER)
Admission: RE | Admit: 2018-10-18 | Discharge: 2018-10-18 | Disposition: A | Discharge status: Home / Self Care | Payer: No Typology Code available for payment source | Source: Ambulatory Visit | Attending: Cardiology | Admitting: Cardiology

## 2018-10-18 DIAGNOSIS — R079 Chest pain, unspecified: Secondary | ICD-10-CM | POA: Diagnosis present

## 2018-10-18 NOTE — Progress Notes (Signed)
*   Echocardiogram 2D Echocardiogram has been performed.   T  10/18/2018, 3:42 PM

## 2018-10-21 ENCOUNTER — Telehealth (HOSPITAL_COMMUNITY): Payer: Self-pay

## 2018-10-21 NOTE — Telephone Encounter (Signed)
Encounter complete. 

## 2018-10-26 ENCOUNTER — Inpatient Hospital Stay (HOSPITAL_COMMUNITY): Admission: RE | Admit: 2018-10-26 | Payer: No Typology Code available for payment source | Source: Ambulatory Visit

## 2018-10-27 ENCOUNTER — Ambulatory Visit (HOSPITAL_COMMUNITY): Payer: No Typology Code available for payment source

## 2019-12-26 IMAGING — DX DG CHEST 2V
2 series · 2 of 2 positions shown · non-contrast
Comparison: None.

CLINICAL DATA: Chest pain.  Hypertension.

EXAM:
CHEST - 2 VIEW

[chest pa]
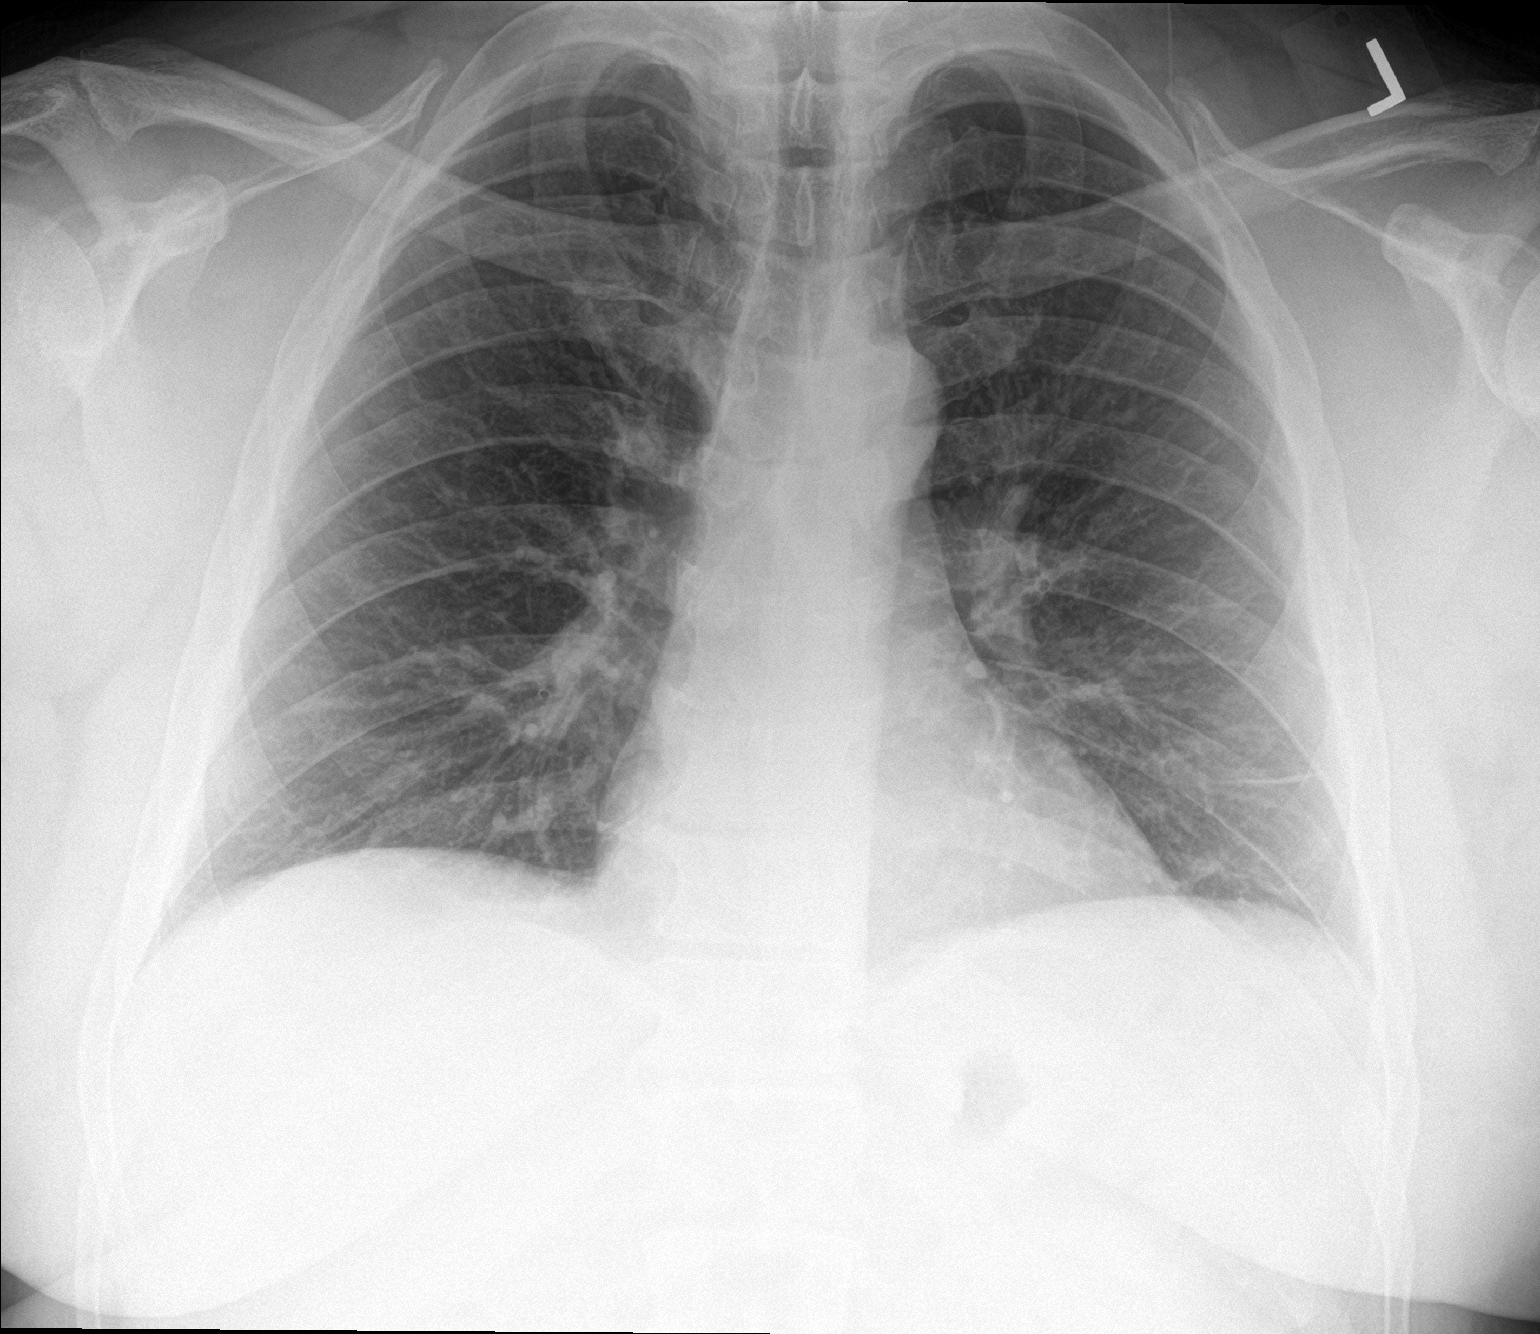

[chest lat]
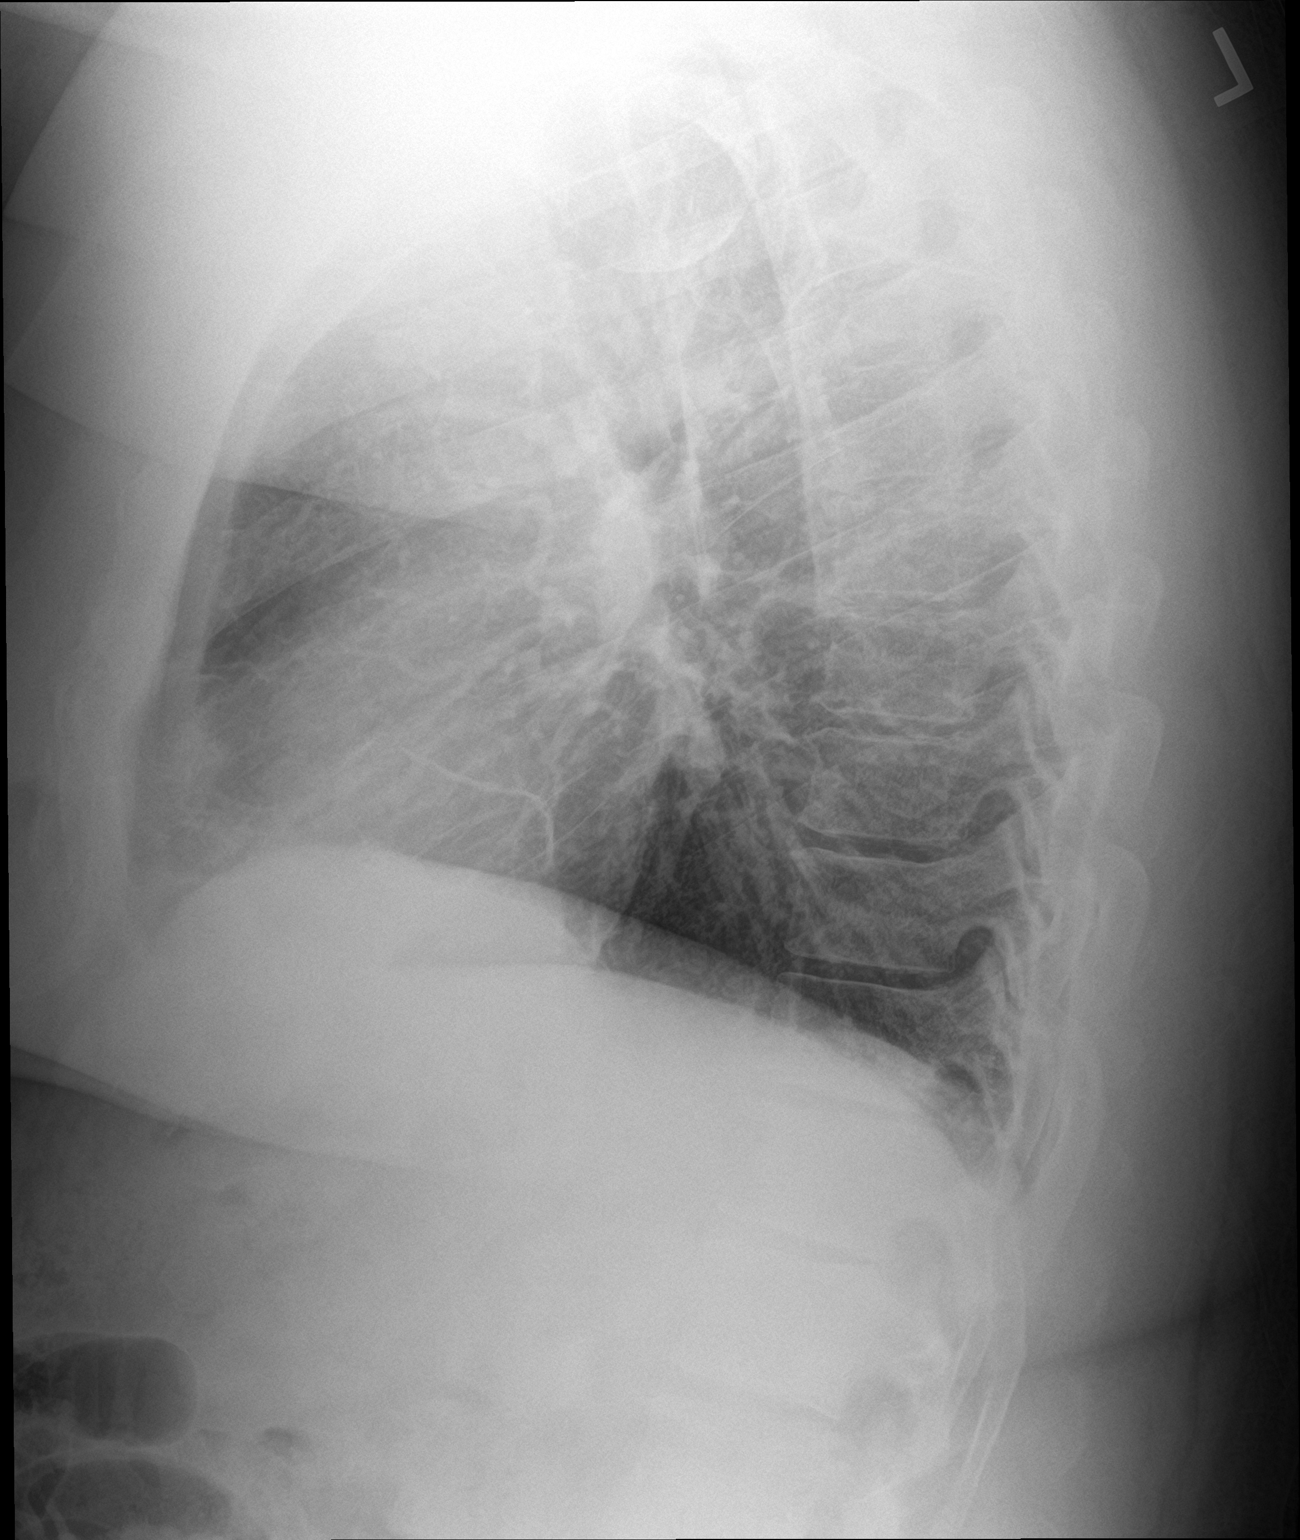

[2 of 2 positions shown; findings below may reference images not displayed]

FINDINGS: There is atelectatic change in the inferior lingula. Lungs elsewhere
clear. Heart size and pulmonary vascularity are normal. No
adenopathy. No pneumothorax. No bone lesions.
IMPRESSION: Inferior lingular atelectasis. Lungs elsewhere clear. Heart size
normal. No adenopathy.

## 2020-04-03 ENCOUNTER — Encounter: Payer: Self-pay | Admitting: *Deleted

## 2020-04-04 ENCOUNTER — Other Ambulatory Visit: Payer: Self-pay

## 2020-04-04 ENCOUNTER — Encounter: Payer: Self-pay | Admitting: Diagnostic Neuroimaging

## 2020-04-04 ENCOUNTER — Ambulatory Visit (INDEPENDENT_AMBULATORY_CARE_PROVIDER_SITE_OTHER): Payer: No Typology Code available for payment source | Admitting: Diagnostic Neuroimaging

## 2020-04-04 VITALS — BP 150/92 | HR 76 | Temp 97.1°F | Ht 73.0 in | Wt 321.8 lb

## 2020-04-04 DIAGNOSIS — G4484 Primary exertional headache: Secondary | ICD-10-CM | POA: Diagnosis not present

## 2020-04-04 DIAGNOSIS — R0683 Snoring: Secondary | ICD-10-CM

## 2020-04-04 DIAGNOSIS — R519 Headache, unspecified: Secondary | ICD-10-CM | POA: Diagnosis not present

## 2020-04-04 NOTE — Progress Notes (Signed)
[facility]  PATIENT: Lee Reynolds DOB: 1971-01-25  REFERRING CLINICIAN: Glenis Smoker, * HISTORY FROM: patient  REASON FOR VISIT: new consult    HISTORICAL  CHIEF COMPLAINT:  Chief Complaint  Patient presents with  . Headache    rm 6 New Pt  "daily headaches > 4 weeks with physical activity; Advil Congestion with fair relief, Tylenol not helpful"    HISTORY OF PRESENT ILLNESS:   48 year old male here for evaluation of headaches.  March 07, 2020 patient started exercise program after turning 49 years old, and felt vertex pressure headache after 30 minutes of exercise.  After patient stopped exercise symptoms resolved.  Since then patient has daily exertional headache with light exertion such as walking, lifting, straining.  He is also noticed some right eye twitching and numbness on his lips that is intermittent.  No prior history of headaches or migraines.   REVIEW OF SYSTEMS: Full 14 system review of systems performed and negative with exception of: As per HPI.  ALLERGIES: Allergies  Allergen Reactions  . Protonix [Pantoprazole] Itching    HOME MEDICATIONS: Outpatient Medications Prior to Visit  Medication Sig Dispense Refill  . Albuterol Sulfate 108 (90 Base) MCG/ACT AEPB Inhale 1 puff into the lungs as needed.    Marland Kitchen allopurinol (ZYLOPRIM) 300 MG tablet TAKE 1 TABLET BY MOUTH ONCE DAILY FOR 90 DAYS  1  . atorvastatin (LIPITOR) 20 MG tablet atorvastatin 20 mg tablet  take 1 tablet by mouth once daily    . cyclobenzaprine (FLEXERIL) 10 MG tablet Take 1 tablet by mouth 3 (three) times daily as needed.    . indomethacin (INDOCIN) 25 MG capsule Take 1 capsule by mouth 2 (two) times daily.    Marland Kitchen loratadine (CLARITIN) 10 MG tablet Take 10 mg by mouth daily.    Marland Kitchen omeprazole (PRILOSEC) 20 MG capsule Take 20 mg by mouth daily.  5   No facility-administered medications prior to visit.    PAST MEDICAL HISTORY: Past Medical History:  Diagnosis Date   . Asthma 10/11/2018  . GERD (gastroesophageal reflux disease) 10/11/2018  . Gout 10/11/2018  . Headache   . Hypercholesterolemia 10/11/2018  . Hyperlipemia   . Right hand pain 06/24/2018  . Trigger finger of right thumb 06/24/2018    PAST SURGICAL HISTORY: Past Surgical History:  Procedure Laterality Date  . BACK SURGERY  10/2008   L4,L5 discectomy    FAMILY HISTORY: Family History  Problem Relation Age of Onset  . Hyperlipidemia Mother   . Cancer Father     SOCIAL HISTORY: Social History   Socioeconomic History  . Marital status: Married    Spouse name: Joelene Millin  . Number of children: 3  . Years of education: Not on file  . Highest education level: Bachelor's degree (e.g., BA, AB, BS)  Occupational History  . Not on file  Tobacco Use  . Smoking status: Current Some Day Smoker    Types: Cigars  . Smokeless tobacco: Never Used  . Tobacco comment: 04/04/20 not daily  Substance and Sexual Activity  . Alcohol use: Never  . Drug use: Never  . Sexual activity: Not on file  Other Topics Concern  . Not on file  Social History Narrative   Lives with wife   Caffeine- none   Social Determinants of Health   Financial Resource Strain:   . Difficulty of Paying Living Expenses:   Food Insecurity:   . Worried About Charity fundraiser in the Last Year:   .  Ran Out of Food in the Last Year:   Transportation Needs:   . Film/video editor (Medical):   Marland Kitchen Lack of Transportation (Non-Medical):   Physical Activity:   . Days of Exercise per Week:   . Minutes of Exercise per Session:   Stress:   . Feeling of Stress :   Social Connections:   . Frequency of Communication with Friends and Family:   . Frequency of Social Gatherings with Friends and Family:   . Attends Religious Services:   . Active Member of Clubs or Organizations:   . Attends Archivist Meetings:   Marland Kitchen Marital Status:   Intimate Partner Violence:   . Fear of Current or Ex-Partner:   .  Emotionally Abused:   Marland Kitchen Physically Abused:   . Sexually Abused:      PHYSICAL EXAM  GENERAL EXAM/CONSTITUTIONAL: Vitals:  Vitals:   04/04/20 1012  BP: (!) 150/92  Pulse: 76  Temp: (!) 97.1 F (36.2 C)  Weight: (!) 321 lb 12.8 oz (146 kg)  Height: '6\' 1"'  (1.854 m)     Body mass index is 42.46 kg/m. Wt Readings from Last 3 Encounters:  04/04/20 (!) 321 lb 12.8 oz (146 kg)  10/11/18 (!) 311 lb (141.1 kg)  10/11/14 (!) 310 lb (140.6 kg)     Patient is in no distress; well developed, nourished and groomed; neck is supple  CARDIOVASCULAR:  Examination of carotid arteries is normal; no carotid bruits  Regular rate and rhythm, no murmurs  Examination of peripheral vascular system by observation and palpation is normal  EYES:  Ophthalmoscopic exam of optic discs and posterior segments is normal; no papilledema or hemorrhages  No exam data present  MUSCULOSKELETAL:  Gait, strength, tone, movements noted in Neurologic exam below  NEUROLOGIC: MENTAL STATUS:  No flowsheet data found.  awake, alert, oriented to person, place and time  recent and remote memory intact  normal attention and concentration  language fluent, comprehension intact, naming intact  fund of knowledge appropriate  CRANIAL NERVE:   2nd - no papilledema on fundoscopic exam  2nd, 3rd, 4th, 6th - pupils equal and reactive to light, visual fields full to confrontation, extraocular muscles intact, no nystagmus  5th - facial sensation symmetric  7th - facial strength symmetric  8th - hearing intact  9th - palate elevates symmetrically, uvula midline  11th - shoulder shrug symmetric  12th - tongue protrusion midline  MOTOR:   normal bulk and tone, full strength in the BUE, BLE  SENSORY:   normal and symmetric to light touch, pinprick, temperature, vibration  COORDINATION:   finger-nose-finger, fine finger movements normal  REFLEXES:   deep tendon reflexes present and  symmetric  GAIT/STATION:   narrow based gait     DIAGNOSTIC DATA (LABS, IMAGING, TESTING) - I reviewed patient records, labs, notes, testing and imaging myself where available.  Lab Results  Component Value Date   WBC 7.3 10/10/2008   HGB 15.6 10/10/2008   HCT 47.9 10/10/2008   MCV 86.7 10/10/2008   PLT 207 10/10/2008      Component Value Date/Time   NA 140 10/10/2008 0925   K 4.4 10/10/2008 0925   CL 104 10/10/2008 0925   CO2 27 10/10/2008 0925   GLUCOSE 99 10/10/2008 0925   BUN 15 10/10/2008 0925   CREATININE 1.35 10/10/2008 0925   CALCIUM 9.3 10/10/2008 0925   GFRNONAA 59 (L) 10/10/2008 0925   GFRAA  10/10/2008 0925    >60  The eGFR has been calculated using the MDRD equation. This calculation has not been validated in all clinical   No results found for: CHOL, HDL, LDLCALC, LDLDIRECT, TRIG, CHOLHDL No results found for: HGBA1C No results found for: VITAMINB12 No results found for: TSH     ASSESSMENT AND PLAN  49 y.o. year old male here with:  Dx:  1. New onset of headaches after age 73   2. Exertional headache   3. Snoring     PLAN:  - check MRI brain w / wo (rule out mass, stroke) - check sleep study consult  Orders Placed This Encounter  Procedures  . MR BRAIN W WO CONTRAST  . Ambulatory referral to Sleep Studies   Return for pending if symptoms worsen or fail to improve.    Penni Bombard, MD 01/29/2810, 88:67 AM Certified in Neurology, Neurophysiology and Neuroimaging  [facility] [address] [place], [place] [address] [phone]

## 2020-04-04 NOTE — Patient Instructions (Signed)
-   check MRI brain w / wo - check sleep study consult

## 2020-04-12 ENCOUNTER — Telehealth: Payer: Self-pay | Admitting: Diagnostic Neuroimaging

## 2020-04-12 NOTE — Telephone Encounter (Signed)
[employer] Auth: NPR Ref # [id number] W order faxed to [facility] they will reach out to the patient to schedule

## 2020-04-13 NOTE — Telephone Encounter (Signed)
scheduled for 04/24/20

## 2020-04-16 ENCOUNTER — Encounter: Payer: Self-pay | Admitting: Neurology

## 2020-04-16 ENCOUNTER — Other Ambulatory Visit: Payer: Self-pay

## 2020-04-16 ENCOUNTER — Ambulatory Visit (INDEPENDENT_AMBULATORY_CARE_PROVIDER_SITE_OTHER): Payer: No Typology Code available for payment source | Admitting: Neurology

## 2020-04-16 VITALS — BP 131/92 | HR 71 | Temp 96.9°F | Ht 73.0 in | Wt 321.0 lb

## 2020-04-16 DIAGNOSIS — R519 Headache, unspecified: Secondary | ICD-10-CM

## 2020-04-16 DIAGNOSIS — R0683 Snoring: Secondary | ICD-10-CM

## 2020-04-16 DIAGNOSIS — Z820 Family history of epilepsy and other diseases of the nervous system: Secondary | ICD-10-CM

## 2020-04-16 DIAGNOSIS — R351 Nocturia: Secondary | ICD-10-CM

## 2020-04-16 DIAGNOSIS — Z6841 Body Mass Index (BMI) 40.0 and over, adult: Secondary | ICD-10-CM

## 2020-04-16 DIAGNOSIS — R0681 Apnea, not elsewhere classified: Secondary | ICD-10-CM

## 2020-04-16 NOTE — Patient Instructions (Signed)
  Here is what we discussed today and what we came up with as our plan for you:    Based on your symptoms and your exam I believe you are at risk for obstructive sleep apnea (aka OSA), and I think we should proceed with a sleep study to determine whether you do or do not have OSA and how severe it is. Even, if you have mild OSA, I may want you to consider treatment with CPAP, as treatment of even borderline or mild sleep apnea can result and improvement of symptoms such as sleep disruption, daytime sleepiness, nighttime bathroom breaks, restless leg symptoms, improvement of headache syndromes, even improved mood disorder.   Please remember, the long-term risks and ramifications of untreated moderate to severe obstructive sleep apnea are: increased Cardiovascular disease, including congestive heart failure, stroke, difficult to control hypertension, treatment resistant obesity, arrhythmias, especially irregular heartbeat commonly known as A. Fib. (atrial fibrillation); even type 2 diabetes has been linked to untreated OSA.   Sleep apnea can cause disruption of sleep and sleep deprivation in most cases, which, in turn, can cause recurrent headaches, problems with memory, mood, concentration, focus, and vigilance. Most people with untreated sleep apnea report excessive daytime sleepiness, which can affect their ability to drive. Please do not drive if you feel sleepy. Patients with sleep apnea developed difficulty initiating and maintaining sleep (aka insomnia).   Having sleep apnea may increase your risk for other sleep disorders, including involuntary behaviors sleep such as sleep terrors, sleep talking, sleepwalking.    Having sleep apnea can also increase your risk for restless leg syndrome and leg movements at night.   Please note that untreated obstructive sleep apnea may carry additional perioperative morbidity. Patients with significant obstructive sleep apnea (typically, in the moderate to severe  degree) should receive, if possible, perioperative PAP (positive airway pressure) therapy and the surgeons and particularly the anesthesiologists should be informed of the diagnosis and the severity of the sleep disordered breathing.   I will likely see you back after your sleep study to go over the test results and where to go from there. We will call you after your sleep study to advise about the results (most likely, you will hear from Kristen, my nurse) and to set up an appointment at the time, as necessary.    Our sleep lab administrative assistant will call you to schedule your sleep study and give you further instructions, regarding the check in process for the sleep study, arrival time, what to bring, when you can expect to leave after the study, etc., and to answer any other logistical questions you may have. If you don't hear back from her by about 2 weeks from now, please feel free to call her direct line at [phone] or you can call our general clinic number, or email us through My Chart.   

## 2020-04-16 NOTE — Progress Notes (Signed)
Subjective:    Patient ID: Lee Reynolds is a 49 y.o. male.  HPI     Star Age, MD, PhD [facility] [address], [address].O. [place], [place] [address]  Dear Bonnita Levan,   I saw your patient, Lee Reynolds, upon your kind request, in my Sleep clinic today for initial consultation of his sleep disorder, in particular, concern for underlying obstructive sleep apnea.  The patient is unaccompanied today.  As you know, Lee Reynolds is a 49 year old right-handed gentleman with an underlying medical history of hyperlipidemia, gout, reflux disease, asthma, recurrent headaches, and morbid obesity with a BMI of over 40, who reports snoring and witnessed breathing pauses while asleep per wife's report.  He also has nocturia about once per average night.  He has had recurrent headaches but denies any nocturnal or morning headaches.  His mother has sleep apnea and uses a CPAP machine.  He would be willing to try CPAP therapy if the need arises.  His Epworth sleepiness score is 5 out of 24, fatigue severity score is 49 out of 63.  His bedtime is around midnight and rise time between 630 and 7.  He lives with his wife and 28-year-old son.  He has a 34 year old daughter and 69 year old daughter as well.  They have no pets in the household.  The TV is on at night but does not bother him.  His wife goes to bed typically later than he does.  He is a non-smoker of cigarettes but rarely smokes a cigar.  He quit drinking alcohol some 20 years ago.  He drinks caffeine in the form of soda, 1/day on average, typically no coffee or tea.  He has his own business.  He wakes up sometimes drenched in sweat and has had palpitations at night.  Weight has fluctuated. I reviewed your office note from 04/04/2020.  You ordered a brain MRI. He is scheduled later this month, he states.  His Past Medical History Is Significant For: Past Medical History:  Diagnosis Date  . Asthma 10/11/2018  . GERD  (gastroesophageal reflux disease) 10/11/2018  . Gout 10/11/2018  . Headache   . Hypercholesterolemia 10/11/2018  . Hyperlipemia   . Right hand pain 06/24/2018  . Trigger finger of right thumb 06/24/2018    His Past Surgical History Is Significant For: Past Surgical History:  Procedure Laterality Date  . BACK SURGERY  10/2008   L4,L5 discectomy    His Family History Is Significant For: Family History  Problem Relation Age of Onset  . Hyperlipidemia Mother   . Cancer Father     His Social History Is Significant For: Social History   Socioeconomic History  . Marital status: Married    Spouse name: Joelene Millin  . Number of children: 3  . Years of education: Not on file  . Highest education level: Bachelor's degree (e.g., BA, AB, BS)  Occupational History  . Not on file  Tobacco Use  . Smoking status: Current Some Day Smoker    Types: Cigars  . Smokeless tobacco: Never Used  . Tobacco comment: occasionally  Substance and Sexual Activity  . Alcohol use: Never  . Drug use: Never  . Sexual activity: Not on file  Other Topics Concern  . Not on file  Social History Narrative   Lives with wife   Caffeine- none   Social Determinants of Health   Financial Resource Strain:   . Difficulty of Paying Living Expenses:   Food Insecurity:   .  Worried About Programme researcher, broadcasting/film/video in the Last Year:   . Barista in the Last Year:   Transportation Needs:   . Freight forwarder (Medical):   Marland Kitchen Lack of Transportation (Non-Medical):   Physical Activity:   . Days of Exercise per Week:   . Minutes of Exercise per Session:   Stress:   . Feeling of Stress :   Social Connections:   . Frequency of Communication with Friends and Family:   . Frequency of Social Gatherings with Friends and Family:   . Attends Religious Services:   . Active Member of Clubs or Organizations:   . Attends Banker Meetings:   Marland Kitchen Marital Status:     His Allergies Are:  Allergies   Allergen Reactions  . Protonix [Pantoprazole] Itching  :   His Current Medications Are:  Outpatient Encounter Medications as of 04/16/2020  Medication Sig  . Albuterol Sulfate 108 (90 Base) MCG/ACT AEPB Inhale 1 puff into the lungs as needed.  Marland Kitchen allopurinol (ZYLOPRIM) 300 MG tablet TAKE 1 TABLET BY MOUTH ONCE DAILY FOR 90 DAYS  . atorvastatin (LIPITOR) 20 MG tablet atorvastatin 20 mg tablet  take 1 tablet by mouth once daily  . cyclobenzaprine (FLEXERIL) 10 MG tablet Take 1 tablet by mouth 3 (three) times daily as needed.  . indomethacin (INDOCIN) 25 MG capsule Take 1 capsule by mouth 2 (two) times daily.  Marland Kitchen loratadine (CLARITIN) 10 MG tablet Take 10 mg by mouth daily.  Marland Kitchen omeprazole (PRILOSEC) 20 MG capsule Take 20 mg by mouth daily.   No facility-administered encounter medications on file as of 04/16/2020.  :  Review of Systems:  Out of a complete 14 point review of systems, all are reviewed and negative with the exception of these symptoms as listed below: Review of Systems  Constitutional: Negative.   HENT: Negative.   Eyes: Negative.   Respiratory: Negative.   Cardiovascular: Negative.   Gastrointestinal: Negative.   Endocrine: Negative.   Genitourinary: Negative.   Musculoskeletal: Negative.   Skin: Negative.   Allergic/Immunologic: Negative.   Neurological: Positive for headaches.       Epworth Sleepiness Scale 0= would never doze 1= slight chance of dozing 2= moderate chance of dozing 3= high chance of dozing  Sitting and reading:0 Watching TV:1 Sitting inactive in a public place (ex. Theater or meeting):0 As a passenger in a car for an hour without a break: Lying down to rest in the afternoon:1 Sitting and talking to someone:0 Sitting quietly after lunch (no alcohol):1 In a car, while stopped in traffic:0 Total:5   Psychiatric/Behavioral: Positive for sleep disturbance.       Snoring     Objective:  Neurological Exam  Physical Exam Physical  Examination:   Vitals:   04/16/20 1318  BP: (!) 131/92  Pulse: 71  Temp: (!) 96.9 F (36.1 C)    General Examination: The patient is a very pleasant 49 y.o. male in no acute distress. He appears well-developed and well-nourished and well groomed.   HEENT: Normocephalic, atraumatic, pupils are equal, round and reactive to light, extraocular tracking is good without limitation to gaze excursion or nystagmus noted. Hearing is grossly intact. Face is symmetric with normal facial animation. Speech is clear with no dysarthria noted. There is no hypophonia. There is no lip, neck/head, jaw or voice tremor. Neck is supple with full range of passive and active motion. There are no carotid bruits on auscultation. Oropharynx exam reveals: mild mouth  dryness, good dental hygiene and moderate airway crowding, due to larger uvula, redundant soft palate, slightly wider tongue.  Tonsils are 1+ bilaterally, Mallampati class II.  Neck circumference 19-1/2 inches.  He has a minimal overbite.  Tongue protrudes centrally and palate elevates symmetrically.  Chest: Clear to auscultation without wheezing, rhonchi or crackles noted.  Heart: S1+S2+0, regular and normal without murmurs, rubs or gallops noted.   Abdomen: Soft, non-tender and non-distended with normal bowel sounds appreciated on auscultation.  Extremities: There is no pitting edema in the distal lower extremities bilaterally.   Skin: Warm and dry without trophic changes noted.   Musculoskeletal: exam reveals no obvious joint deformities, tenderness or joint swelling or erythema.   Neurologically:  Mental status: The patient is awake, alert and oriented in all 4 spheres. His immediate and remote memory, attention, language skills and fund of knowledge are appropriate. There is no evidence of aphasia, agnosia, apraxia or anomia. Speech is clear with normal prosody and enunciation. Thought process is linear. Mood is normal and affect is normal.  Cranial  nerves II - XII are as described above under HEENT exam.  Motor exam: Normal bulk, strength and tone is noted. There is no tremor, Romberg is negative. Fine motor skills and coordination: grossly intact.  Cerebellar testing: No dysmetria or intention tremor. There is no truncal or gait ataxia.  Sensory exam: intact to light touch in the upper and lower extremities.  Gait, station and balance: He stands easily. No veering to one side is noted. No leaning to one side is noted. Posture is age-appropriate and stance is narrow based. Gait shows normal stride length and normal pace. No problems turning are noted. Tandem walk is unremarkable.     Assessment and Plan:   In summary, GENTRY PILSON is a very pleasant 49 y.o.-year old male with an underlying medical history of hyperlipidemia, gout, reflux disease, asthma, recurrent headaches, and morbid obesity with a BMI of over 40, whose history and physical exam are concerning for obstructive sleep apnea (OSA). I had a long chat with the patient about my findings and the diagnosis of OSA, its prognosis and treatment options. We talked about medical treatments, surgical interventions and non-pharmacological approaches. I explained in particular the risks and ramifications of untreated moderate to severe OSA, especially with respect to developing cardiovascular disease down the Road, including congestive heart failure, difficult to treat hypertension, cardiac arrhythmias, or stroke. Even type 2 diabetes has, in part, been linked to untreated OSA. Symptoms of untreated OSA include daytime sleepiness, memory problems, mood irritability and mood disorder such as depression and anxiety, lack of energy, as well as recurrent headaches, especially morning headaches. We talked about smoking cessation and trying to maintain a healthy lifestyle in general, as well as the importance of weight control. We also talked about the importance of good sleep hygiene. I recommended the  following at this time: sleep study.   I explained the sleep test procedure to the patient and also outlined possible surgical and non-surgical treatment options of OSA, including the use of a custom-made dental device (which would require a referral to a specialist dentist or oral surgeon), upper airway surgical options, such as traditional UPPP or a novel less invasive surgical option in the form of Inspire hypoglossal nerve stimulation (which would involve a referral to an ENT surgeon). I also explained the CPAP treatment option to the patient, who indicated that he would be willing to try CPAP if the need arises. I explained  the importance of being compliant with PAP treatment, not only for insurance purposes but primarily to improve His symptoms, and for the patient's long term health benefit, including to reduce His cardiovascular risks. I answered all his questions today and the patient was in agreement. I plan to see him back after the sleep study is completed and encouraged him to call with any interim questions, concerns, problems or updates.   Thank you very much for allowing me to participate in the care of this nice patient. If I can be of any further assistance to you please do not hesitate to talk to me.  Sincerely,   Huston Foley, MD, PhD

## 2020-04-25 ENCOUNTER — Telehealth: Payer: Self-pay | Admitting: *Deleted

## 2020-04-25 NOTE — Telephone Encounter (Addendum)
Received report and Disk  from [facility]:  MRI brain. Placed on MD's desk for review.

## 2020-04-26 NOTE — Telephone Encounter (Signed)
LVM informing patient Dr Marjory Lies reviewed report and disk of his MRI brain from [facility]. Advised his results are unremarkable. Left # for questions.

## 2020-05-28 ENCOUNTER — Other Ambulatory Visit: Payer: Self-pay

## 2020-05-28 ENCOUNTER — Ambulatory Visit (INDEPENDENT_AMBULATORY_CARE_PROVIDER_SITE_OTHER): Payer: No Typology Code available for payment source | Admitting: Neurology

## 2020-05-28 DIAGNOSIS — R0683 Snoring: Secondary | ICD-10-CM

## 2020-05-28 DIAGNOSIS — R0681 Apnea, not elsewhere classified: Secondary | ICD-10-CM

## 2020-05-28 DIAGNOSIS — R351 Nocturia: Secondary | ICD-10-CM

## 2020-05-28 DIAGNOSIS — R519 Headache, unspecified: Secondary | ICD-10-CM

## 2020-05-28 DIAGNOSIS — Z820 Family history of epilepsy and other diseases of the nervous system: Secondary | ICD-10-CM

## 2020-05-28 DIAGNOSIS — G4733 Obstructive sleep apnea (adult) (pediatric): Secondary | ICD-10-CM

## 2020-06-06 ENCOUNTER — Telehealth: Payer: Self-pay

## 2020-06-06 NOTE — Addendum Note (Signed)
Addended by: Huston Foley on: 06/06/2020 07:45 AM   Modules accepted: Orders

## 2020-06-06 NOTE — Procedures (Signed)
Patient Information     First Name: Lee Last Name: Reynolds ID: [id number]  Birth Date:  [birth year]  Age:  Gender: Male  Referring Provider:          Dr. Marjory Lies BMI: 63.2 (W=322 lb, H=5' 0'')  Neck Circ.:  17 ''    History:    49 year old man with a history of hyperlipidemia, gout, reflux disease, asthma, recurrent headaches, and morbid obesity with a BMI of over 40, who reports snoring and witnessed breathing pauses while asleep, per wife's report.  He also has nocturia about once per average night. He has had recurrent headaches.  Summary & Diagnosis:     OSA Recommendations:     This home sleep test demonstrates moderate obstructive sleep apnea (by number of events) with a total AHI of 20.7/hour and O2 nadir of 89%. Given the patient's medical history and sleep related complaints, treatment with positive airway pressure (in the form of CPAP) is recommended. Treatment can be achieved in the form of autoPAP titration/trial at home for now. Please note that untreated obstructive sleep apnea may carry additional perioperative morbidity. Patients with significant obstructive sleep apnea should receive perioperative PAP therapy and the surgeons and particularly the anesthesiologist should be informed of the diagnosis and the severity of the sleep disordered breathing. The patient should be cautioned not to drive, work at heights, or operate dangerous or heavy equipment when tired or sleepy. Review and reiteration of good sleep hygiene measures should be pursued with any patient. Other causes of the patient's symptoms, including circadian rhythm disturbances, an underlying mood disorder, medication effect and/or an underlying medical problem cannot be ruled out based on this test. Clinical correlation is recommended. The patient and his referring provider will be notified of the test results. The patient will be seen in follow up in sleep clinic at [facility].  I certify that I have reviewed the raw data  recording prior to the issuance of this report in accordance with the standards of the American Academy of Sleep Medicine (AASM).  Huston Foley, MD, PhD [facility]) Diplomat, ABPN (Neurology and Sleep)               Sleep Summary    Oxygen Saturation Statistics     Start Study Time: End Study Time: Total Recording Time:  11:29:40PM 6:47:03 AM 7 hrs,  Total Sleep Time % REM of Sleep Time:  6 hrs, 33 min 34.1    Mean: 95 Minimum: 89 Maximum: 100  Mean of Desaturations Nadirs (%):   93  Oxygen Desatur. %:  4-9 10-20 >20 Total  Events Number Total   53 98.1 1 0  1.9 0.0  54 100.0  Oxygen Saturation: <90 <=88  <85 <80 <70  Duration (minutes): Sleep % 0.1 0.0 0.0 0.0  0.0 0.0  0.0 0.0 0.0 0.0     Respiratory Indices      Total Events REM NREM All Night  pRDI:  163  pAHI:  135 ODI:  54  pAHIc:  2  % CSR: 0.0 41.6 35.8 13.6 0.5 16.5 13.0 5.6 0.2 25.0 20.7 8.3 0.3       Pulse Rate Statistics during Sleep (BPM)      Mean: 61 Minimum: 50 Maximum:  88    Indices are calculated using technically valid sleep time of  6 hrs, 30 min. pRDI/pAHI are calculated using oxi desaturations ? 3%   Body Position Statistics  Position Supine Prone Right Left Non-Supine  Sleep (min) 337.9 0.0 55.5 0.0 55.5  Sleep % 85.9 0.0 14.1 0.0 14.1  pRDI 28.1 N/A 6.5 N/A 6.5  pAHI 23.4 N/A 4.4 N/A 4.4  ODI 9.3 N/A 2.2 N/A 2.2     Snoring Statistics Snoring Level (dB) >40 >50 >60 >70 >80 >Threshold (45)  Sleep (min) 247.2 64.4 20.0 0.0 0.0 110.3  Sleep % 62.8 16.4 5.1 0.0 0.0 28.0    Mean: 45 dB Sleep Stages Chart                                pAHI=20.7                                            Mild              Moderate                    Severe

## 2020-06-06 NOTE — Progress Notes (Signed)
Patient referred by Dr. Marjory Lies, seen by me on 04/16/20, HST on 05/28/20. Please call and notify the patient that the recent home sleep test showed obstructive sleep apnea. OSA is in the moderate range and worth treating to see if he feels better after treatment, especially, with regards to his headaches. To that end I recommend treatment for this in the form of autoPAP, which means, that we don't have to bring him in for a sleep study with CPAP, but will let him try an autoPAP machine at home, through a DME company (of his choice, or as per insurance requirement). The DME representative will educate him on how to use the machine, how to put the mask on, etc. I have placed an order in the chart. Please send referral, talk to patient (explaining also compliance requirement), send report to referring MD. We will need a FU in sleep clinic for 10 weeks post-PAP set up, please arrange that with me or one of our NPs. Thanks,   Huston Foley, MD, PhD [facility])

## 2020-06-06 NOTE — Telephone Encounter (Signed)
-----   Message from Huston Foley, MD sent at 06/06/2020  7:45 AM EDT ----- Patient referred by Dr. Marjory Lies, seen by me on 04/16/20, HST on 05/28/20. Please call and notify the patient that the recent home sleep test showed obstructive sleep apnea. OSA is in the moderate range and worth treating to see if he feels better after treatment, especially, with regards to his headaches. To that end I recommend treatment for this in the form of autoPAP, which means, that we don't have to bring him in for a sleep study with CPAP, but will let him try an autoPAP machine at home, through a DME company (of his choice, or as per insurance requirement). The DME representative will educate him on how to use the machine, how to put the mask on, etc. I have placed an order in the chart. Please send referral, talk to patient (explaining also compliance requirement), send report to referring MD. We will need a FU in sleep clinic for 10 weeks post-PAP set up, please arrange that with me or one of our NPs. Thanks,   Huston Foley, MD, PhD [facility])

## 2020-06-06 NOTE — Telephone Encounter (Signed)
I called pt. No answer, left a message asking pt to call me back.   

## 2020-06-12 NOTE — Telephone Encounter (Signed)
I called pt. I advised pt that Dr. Frances Furbish  reviewed their sleep study results and found that pt had moderate osa. Dr. Frances Furbish recommends that pt start an autopap at home for treatment. I reviewed PAP compliance expectations with the pt. Pt is agreeable to starting an auto-PAP. I advised pt that an order will be sent to a DME, [facility], and [facility] will call the pt within about one week after they file with the pt's insurance. [facility] will show the pt how to use the machine, fit for masks, and troubleshoot the auto-PAP if needed. A follow up appt was made for insurance purposes with Dr. Frances Furbish on 08/21/2020 at 100 pm. Pt verbalized understanding to arrive 15 minutes early and bring their auto-PAP. A letter with all of this information in it will be mailed to the pt as a reminder. I verified with the pt that the address we have on file is correct. Pt verbalized understanding of results. Pt had no questions at this time but was encouraged to call back if questions arise. I have sent the order to [facility] and have received confirmation that they have received the order.

## 2020-08-21 ENCOUNTER — Ambulatory Visit: Payer: Self-pay | Admitting: Neurology

## 2021-01-03 ENCOUNTER — Other Ambulatory Visit: Payer: Self-pay | Admitting: Sports Medicine

## 2021-01-03 DIAGNOSIS — M25511 Pain in right shoulder: Secondary | ICD-10-CM

## 2021-01-20 ENCOUNTER — Ambulatory Visit
Admission: RE | Admit: 2021-01-20 | Discharge: 2021-01-20 | Disposition: A | Discharge status: Home / Self Care | Payer: No Typology Code available for payment source | Source: Ambulatory Visit | Attending: Sports Medicine | Admitting: Sports Medicine

## 2021-01-20 ENCOUNTER — Other Ambulatory Visit: Payer: Self-pay

## 2021-01-20 DIAGNOSIS — M25511 Pain in right shoulder: Secondary | ICD-10-CM

## 2021-02-05 ENCOUNTER — Encounter: Payer: Self-pay | Admitting: Physical Therapy

## 2021-02-05 ENCOUNTER — Ambulatory Visit: Payer: No Typology Code available for payment source | Attending: Sports Medicine | Admitting: Physical Therapy

## 2021-02-05 ENCOUNTER — Other Ambulatory Visit: Payer: Self-pay

## 2021-02-05 DIAGNOSIS — G8929 Other chronic pain: Secondary | ICD-10-CM

## 2021-02-05 DIAGNOSIS — M79652 Pain in left thigh: Secondary | ICD-10-CM

## 2021-02-05 DIAGNOSIS — M6281 Muscle weakness (generalized): Secondary | ICD-10-CM

## 2021-02-05 DIAGNOSIS — M25611 Stiffness of right shoulder, not elsewhere classified: Secondary | ICD-10-CM

## 2021-02-05 DIAGNOSIS — R293 Abnormal posture: Secondary | ICD-10-CM

## 2021-02-05 DIAGNOSIS — M25511 Pain in right shoulder: Secondary | ICD-10-CM

## 2021-02-05 DIAGNOSIS — M5442 Lumbago with sciatica, left side: Secondary | ICD-10-CM | POA: Insufficient documentation

## 2021-02-05 NOTE — Patient Instructions (Addendum)
Sleeping on Back  Place pillow under knees. A pillow with cervical support and a roll around waist are also helpful. Copyright  VHI. All rights reserved.  Sleeping on Side Place pillow between knees. Use cervical support under neck and a roll around waist as needed. Copyright  VHI. All rights reserved.   Sleeping on Stomach   If this is the only desirable sleeping position, place pillow under lower legs, and under stomach or chest as needed.  Posture - Sitting   Sit upright, head facing forward. Try using a roll to support lower back. Keep shoulders relaxed, and avoid rounded back. Keep hips level with knees. Avoid crossing legs for long periods. Stand to Sit / Sit to Stand   To sit: Bend knees to lower self onto front edge of chair, then scoot back on seat. To stand: Reverse sequence by placing one foot forward, and scoot to front of seat. Use rocking motion to stand up.   Work Height and Reach  Ideal work height is no more than 2 to 4 inches below elbow level when standing, and at elbow level when sitting. Reaching should be limited to arm's length, with elbows slightly bent.  Bending  Bend at hips and knees, not back. Keep feet shoulder-width apart.    Posture - Standing   Good posture is important. Avoid slouching and forward head thrust. Maintain curve in low back and align ears over shoul- ders, hips over ankles.  Alternating Positions   Alternate tasks and change positions frequently to reduce fatigue and muscle tension. Take rest breaks. Computer Work   Position work to face forward. Use proper work and seat height. Keep shoulders back and down, wrists straight, and elbows at right angles. Use chair that provides full back support. Add footrest and lumbar roll as needed.  Getting Into / Out of Car  Lower self onto seat, scoot back, then bring in one leg at a time. Reverse sequence to get out.  Dressing  Lie on back to pull socks or slacks over feet, or sit  and bend leg while keeping back straight.    Housework - Sink  Place one foot on ledge of cabinet under sink when standing at sink for prolonged periods.   Pushing / Pulling  Pushing is preferable to pulling. Keep back in proper alignment, and use leg muscles to do the work.  Deep Squat   Squat and lift with both arms held against upper trunk. Tighten stomach muscles without holding breath. Use smooth movements to avoid jerking.  Avoid Twisting   Avoid twisting or bending back. Pivot around using foot movements, and bend at knees if needed when reaching for articles.  Carrying Luggage   Distribute weight evenly on both sides. Use a cart whenever possible. Do not twist trunk. Move body as a unit.   Lifting Principles .Maintain proper posture and head alignment. .Slide object as close as possible before lifting. .Move obstacles out of the way. .Test before lifting; ask for help if too heavy. .Tighten stomach muscles without holding breath. .Use smooth movements; do not jerk. .Use legs to do the work, and pivot with feet. .Distribute the work load symmetrically and close to the center of trunk. .Push instead of pull whenever possible.   Ask For Help   Ask for help and delegate to others when possible. Coordinate your movements when lifting together, and maintain the low back curve.  Log Roll   Lying on back, bend left knee and place left   arm across chest. Roll all in one movement to the right. Reverse to roll to the left. Always move as one unit. Housework - Sweeping  Use long-handled equipment to avoid stooping.   Housework - Wiping  Position yourself as close as possible to reach work surface. Avoid straining your back.  Laundry - Unloading Wash   To unload small items at bottom of washer, lift leg opposite to arm being used to reach.  Gardening - Raking  Move close to area to be raked. Use arm movements to do the work. Keep back straight and avoid  twisting.     Cart  When reaching into cart with one arm, lift opposite leg to keep back straight.   Getting Into / Out of Bed  Lower self to lie down on one side by raising legs and lowering head at the same time. Use arms to assist moving without twisting. Bend both knees to roll onto back if desired. To sit up, start from lying on side, and use same move-ments in reverse. Housework - Vacuuming  Hold the vacuum with arm held at side. Step back and forth to move it, keeping head up. Avoid twisting.   Laundry - Armed forces training and education officer so that bending and twisting can be avoided.   Laundry - Unloading Dryer  Squat down to reach into clothes dryer or use a reacher.  Gardening - Weeding / Psychiatric nurse or Kneel. Knee pads may be helpful.                     IONTOPHORESIS PATIENT PRECAUTIONS & CONTRAINDICATIONS:  . Redness under one or both electrodes can occur.  This characterized by a uniform redness that usually disappears within 12 hours of treatment. . Small pinhead size blisters may result in response to the drug.  Contact your physician if the problem persists more than 24 hours. . On rare occasions, iontophoresis therapy can result in temporary skin reactions such as rash, inflammation, irritation or burns.  The skin reactions may be the result of individual sensitivity to the ionic solution used, the condition of the skin at the start of treatment, reaction to the materials in the electrodes, allergies or sensitivity to dexamethasone, or a poor connection between the patch and your skin.  Discontinue using iontophoresis if you have any of these reactions and report to your therapist. . Remove the Patch or electrodes if you have any undue sensation of pain or burning during the treatment and report discomfort to your therapist. . Tell your Therapist if you have had known adverse reactions to the application of electrical current. . If using the  Patch, the LED light will turn off when treatment is complete and the patch can be removed.  Approximate treatment time is 1-3 hours.  Remove the patch when light goes off or after 6 hours. . The Patch can be worn during normal activity, however excessive motion where the electrodes have been placed can cause poor contact between the skin and the electrode or uneven electrical current resulting in greater risk of skin irritation. Marland Kitchen Keep out of the reach of children.   . DO NOT use if you have a cardiac pacemaker or any other electrically sensitive implanted device. . DO NOT use if you have a known sensitivity to dexamethasone. . DO NOT use during Magnetic Resonance Imaging (MRI). . DO NOT use over broken or compromised skin (e.g. sunburn, cuts, or acne) due to the increased risk  of skin reaction. . DO NOT SHAVE over the area to be treated:  To establish good contact between the Patch and the skin, excessive hair may be clipped. . DO NOT place the Patch or electrodes on or over your eyes, directly over your heart, or brain. . DO NOT reuse the Patch or electrodes as this may cause burns to occur.  Garen Lah, PT, [facility] Certified Exercise Expert for the Aging Adult  02/05/21 10:55 AM Phone: [phone] Fax: [phone]

## 2021-02-05 NOTE — Therapy (Addendum)
[place], [place], [address] Phone: [phone]   Fax:  [phone]  Physical Therapy Evaluation/Discharge Note    Patient Details  Name: Lee Reynolds MRN: [id number] Date of Birth: 1971-03-06 Referring Provider (PT): Inez Catalina MD  PHYSICAL THERAPY DISCHARGE SUMMARY  Visits from Start of Care: 1  Current functional level related to goals / functional outcomes: unknown   Remaining deficits: unknown   Education / Equipment: Intial HEP   Patient agrees to discharge. Patient goals were not met. Patient is being discharged due to not returning since the last visit.  Encounter Date: 02/05/2021   PT End of Session - 02/05/21 1701     Visit Number 1    Number of Visits 8    Date for PT Re-Evaluation 04/02/21    Authorization Type [employer] Armandina Gemma  / self pay    PT Start Time 0630    PT Stop Time 1100    PT Time Calculation (min) 45 min    Activity Tolerance Patient tolerated treatment well    Behavior During Therapy [facility] for tasks assessed/performed             Past Medical History:  Diagnosis Date   Asthma 10/11/2018   GERD (gastroesophageal reflux disease) 10/11/2018   Gout 10/11/2018   Headache    Hypercholesterolemia 10/11/2018   Hyperlipemia    Right hand pain 06/24/2018   Trigger finger of right thumb 06/24/2018    Past Surgical History:  Procedure Laterality Date   BACK SURGERY  10/2008   L4,L5 discectomy    There were no vitals filed for this visit.    Subjective Assessment - 02/05/21 1020     Subjective I have R shoulder problem since June 2020 and it has gotten progressively worse.  I now can only sleep on my left side because My shoulder hurts so bad  I had a back surgery in 2009 and I recently had numbness in my left front of thigh and numbness on the top of my foot.    Pertinent History asthma, GERD  , trigger finger  back See medical record hx    Limitations  Sitting;Lifting    How long can you sit comfortably? 1  hour    How long can you stand comfortably? 30 minutes has more pain    How long can you walk comfortably? I walk every morning about a mile,  and 5 and 1/2 miles on bike    Diagnostic tests MRI r shldMild appearing supraspinatus and infraspinatus tendinopathy without  tear. mild AC jt arthritis    Patient Stated Goals When I am able to sleep on my right side and throw a ball with my son with my right arm    Currently in Pain? Yes    Pain Score 7    9/10  lying on R shld   Pain Location Shoulder    Pain Orientation Right    Pain Descriptors / Indicators Sore    Pain Type Chronic pain    Pain Onset More than a month ago    Pain Frequency Constant    Aggravating Factors  lifting with r arm, sleeping on R side    Pain Relieving Factors aleve    Multiple Pain Sites Yes    Pain Score 5    Pain Location Back    Pain Orientation Left    Pain Descriptors / Indicators Numbness    Pain Type Chronic pain  Pain Radiating Towards radiates into Left foot    Pain Onset More than a month ago    Pain Frequency Constant    Aggravating Factors  I feel more in the morning when I get up                [facility] PT Assessment - 02/05/21 0001       Assessment   Medical Diagnosis R RTC tendonitis, lumbar radiulities,  AC arthritis    Referring Provider (PT) Inez Catalina MD    Onset Date/Surgical Date 02/05/21   back in last 2 months and R RTC since june 2020   [place] Right    Next MD Visit See MD in May 2022    Prior Therapy back after 2009 surgery but not shoulder      Precautions   Precautions None      Restrictions   Weight Bearing Restrictions No      Balance Screen   Has the patient fallen in the past 6 months No    Has the patient had a decrease in activity level because of a fear of falling?  No    Is the patient reluctant to leave their home because of a fear of falling?  No      Home Manufacturing systems engineer residence    Living Arrangements Children;Spouse/significant other    Type of [place] to enter    Entrance Stairs-Number of Steps 2    [place] Two level      Prior Function   Level of Independence Independent    Vocation Full time employment    Vocation Requirements picking up items, working on Chief Executive Officer   Overall Cognitive Status Within Functional Limits for tasks assessed      Observation/Other Assessments   Focus on Therapeutic Outcomes (FOTO)  FOTO Shoulder intake 56% predicted 70%, lumbar spine intake 75% predicted 76%      Posture/Postural Control   Posture/Postural Control Postural limitations    Postural Limitations Forward head;Rounded Shoulders    Posture Comments anterior forward shld Right, increased abdominal girth,      ROM / Strength   AROM / PROM / Strength AROM;Strength      AROM   Right Shoulder Extension 30 Degrees    Right Shoulder Flexion 129 Degrees    Right Shoulder ABduction 112 Degrees    Right Shoulder Internal Rotation 45 Degrees    Right Shoulder External Rotation 75 Degrees    Left Shoulder Extension 30 Degrees    Left Shoulder Flexion 170 Degrees    Left Shoulder ABduction 165 Degrees    Left Shoulder Internal Rotation 65 Degrees    Left Shoulder External Rotation 95 Degrees    Right Hip Flexion 120    Left Hip Flexion 120    Lumbar Flexion 80   toucing top of foot   Lumbar Extension 15    Lumbar - Right Side Bend 20   feels better on Left LE   Lumbar - Left Side Bend 20    Lumbar - Right Rotation 80%    Lumbar - Left Rotation 80%      Strength   Overall Strength Deficits    Right Shoulder Flexion 3-/5   painful arc   Right Shoulder Extension 4+/5    Right Shoulder ABduction 3-/5   painful arc   Right Shoulder Internal Rotation 4-/5  Right Shoulder External Rotation 4-/5    Left Shoulder Flexion 5/5    Left Shoulder Extension 5/5    Left Shoulder ABduction 5/5    Left  Shoulder Internal Rotation 5/5    Left Shoulder External Rotation 5/5    Right Hand Grip (lbs) 95 lb    Left Hand Grip (lbs) 100 lb    Right Hip Flexion 4+/5    Right Hip Extension 4+/5    Right Hip ABduction 4+/5    Left Hip Flexion 4-/5    Left Hip Extension 4-/5    Left Hip ABduction 4-/5      Flexibility   Hamstrings Pt R LE 65, L LE 60      Palpation   Spinal mobility hypomobiity Lumbar spine    Palpation comment tenderness over anteior shld over AC joint.  TTP over supraspinatus, infraspinatus R shld                        Objective measurements completed on examination: See above findings.       [facility] Adult PT Treatment/Exercise - 02/05/21 0001       Modalities   Modalities Iontophoresis      Iontophoresis   Type of Iontophoresis Dexamethasone    Location R ant shld    Dose 1cc    Time 4-6 hours patch to be removed after 6 hours                    PT Education - 02/05/21 1700     Education Details POC Explanation of findings, Intial HEP  Iontophoresis    Person(s) Educated Patient    Methods Explanation;Demonstration;Tactile cues;Verbal cues;Handout    Comprehension Verbalized understanding;Returned demonstration              PT Short Term Goals - 02/05/21 1056       PT SHORT TERM GOAL #1   Title pt will be independent with intial HEP    Baseline no knowledge    Time 4    Period Weeks    Status New    Target Date 03/05/21      PT SHORT TERM GOAL #2   Title Pt will gain AROM of R shoulder to at least 140 degrees flexion without pain    Baseline eval 129  7/10 pain    Time 4    Period Weeks    Status New    Target Date 03/05/21      PT SHORT TERM GOAL #3   Title Pt will be able to lie on R side without pain    Baseline unable to lie of right side    Time 8    Period Weeks    Status New    Target Date 03/05/21               PT Long Term Goals - 02/05/21 1057       PT LONG TERM GOAL #1   Title pt will  be independent with HEP and be able to utilize home gym equipment without exacerbating pain    Baseline limited knowledge    Time 8    Period Weeks    Status New    Target Date 04/02/21      PT LONG TERM GOAL #2   Title  FOTO for shld  will improve from 56%   to 70%    indicating improved functional mobility .    Baseline  eval 56%    Time 8    Period Weeks    Status New    Target Date 04/02/21      PT LONG TERM GOAL #3   Title R shoulder AROM scaption will improve to 0-160 degrees for improved overhead reaching.    Baseline R shld flex 129    Time 8    Period Weeks    Status New    Target Date 04/02/21      PT LONG TERM GOAL #4   Title Pt will be able to sleep on R side without exacerbating shld pain to sleep for 4 or more uninterrupted hours of sleep    Time 8    Period Weeks    Status New    Target Date 04/02/21      PT LONG TERM GOAL #5   Title Pt will be able to decrease numbness in LE by 50% using exercises    Baseline constant numbness over ant thigh and foor dorsum of L LE    Time 4    Period Weeks    Status New    Target Date 04/02/21                    Plan - 02/05/21 1450     Clinical Impression Statement 50 yo presents with a 2 year history of R shld pain but recently in last 8month developed R supraspinatus/ infraspinatus tendonitis and increasing difficulty lying on R shld and inability to lift arm over painful arc.  ( R shld flex 129) Pt has anteriorly rotated forward R scapula and TTP over Anterior AC jt and over distal portion of supraspinatus and infraspinatus.  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  Pt pain in back in truly only numbness over anterior thigh and over dorsum of foot. He does not report any pain rather than recent numbness.   He had back surgery in 2009 and he has recently noticed increasing numbness but no pain.  Pt walks about a mile and rides a  stationary bike about 5 miles every day and he would like to return to sleeping without pain and be able to throw ball with his son.  Pt will benefit from skilled PT to address impairments and enable pt to return to exercise for health.    Personal Factors and Comorbidities Comorbidity 1    Comorbidities asthma, GERD  , trigger finger  back See medical record hx    Examination-Activity Limitations Reach Overhead;Lift;Dressing;Carry;Bend    Examination-Participation Restrictions Occupation    Stability/Clinical Decision Making Evolving/Moderate complexity    Clinical Decision Making Moderate    Rehab Potential Good    PT Frequency 1x / week    PT Duration 8 weeks    PT Treatment/Interventions ADLs/Self Care Home Management;Cryotherapy;Electrical Stimulation;Iontophoresis 455mml Dexamethasone;Moist Heat;Ultrasound;Therapeutic exercise;Therapeutic activities;Neuromuscular re-education;Patient/family education;Passive range of motion;Manual techniques;Dry needling;Taping;Joint Manipulations    PT Next Visit Plan Review HEP  go over posture body mechanics given as homework    PT Home Exercise Plan [facility]           Patient will benefit from skilled therapeutic intervention in order to improve the following deficits and impairments:  Obesity,Pain,Impaired UE functional use,Improper body mechanics,Postural dysfunction,Hypomobility,Decreased range of motion,Decreased strength  Visit Diagnosis: Right shoulder pain, unspecified chronicity  Pain of left thigh  Stiffness of right shoulder, not elsewhere classified  Muscle weakness (generalized)  Abnormal posture  Chronic left-sided low back pain with left-sided sciatica   Access Code: [email or web address]: [email or web address]: 03/08/2022Prepared by: Donnetta Simpers BeardsleyExercises  Seated Hamstring Stretch - 2 x daily - 7 x weekly - 1 sets - 3 reps - 30 hold  Supine Sciatic Nerve Glide - 2 x daily - 7 x weekly - 1 sets - 10 reps   Isometric Shoulder Flexion at Wall - 2 x daily - 5 x weekly - 1 sets - 5 reps - 5 hold  Isometric Shoulder Abduction at Wall - 2 x daily - 5 x weekly - 1 sets - 5 reps - 5 hold  Isometric Shoulder External Rotation at Wall - 2 x daily - 5 x weekly - 1 sets - 5 reps - 5 hold  Standing Isometric Shoulder Internal Rotation at Doorway - 2 x daily - 5 x weekly - 1 sets - 2 reps - 2 hold  Seated Scapular Retraction - 3 x daily - 5 x weekly - 2 sets - 10 reps - 3 hold    Problem List Patient Active Problem List   Diagnosis Date Noted   Asthma 10/11/2018   Gout 10/11/2018   GERD (gastroesophageal reflux disease) 10/11/2018   Hypercholesterolemia 10/11/2018   Chest pain 10/11/2018   Morbid obesity ([place]) 10/11/2018   Right hand pain 06/24/2018   Trigger finger of right thumb 06/24/2018   Voncille Lo, PT, [place] Certified Exercise Expert for the Aging Adult  02/05/21 5:11 PM Phone: [phone] Fax: [facility] [address] [place], [place], [address] Phone: [phone]   Fax:  [phone]  Name: AYCEN PORRECA MRN: [id number] Date of Birth: [birth year]

## 2021-02-20 ENCOUNTER — Telehealth: Payer: Self-pay | Admitting: Physical Therapy

## 2021-02-20 ENCOUNTER — Ambulatory Visit: Payer: No Typology Code available for payment source | Admitting: Physical Therapy

## 2021-02-20 NOTE — Telephone Encounter (Signed)
Attempted to call patient regarding no show for 8:45 PT appointment. Left voicemail including reminder of next scheduled appointment and request to contact clinic if needing to cancel or reschedule.

## 2021-02-26 ENCOUNTER — Telehealth: Payer: Self-pay | Admitting: Physical Therapy

## 2021-02-26 ENCOUNTER — Ambulatory Visit: Payer: Self-pay | Admitting: Physical Therapy

## 2021-02-26 NOTE — Telephone Encounter (Signed)
Mr Bhagat did not show for scheduled appt at 8:00 on 02-26-21.  Pt was told next appt time and reminded of the cancellation policy.  Pt was asked to call clinic at [phone] to confirm receiving phone message and/or to cancel further appt if he no longer wants physical therapy  Garen Lah, PT, [facility] Certified Exercise Expert for the Aging Adult  02/26/21 8:17 AM Phone: [phone] Fax: [phone]

## 2021-03-05 ENCOUNTER — Ambulatory Visit: Payer: Self-pay | Admitting: Physical Therapy

## 2021-03-12 ENCOUNTER — Encounter: Payer: No Typology Code available for payment source | Admitting: Physical Therapy

## 2021-03-19 ENCOUNTER — Encounter: Payer: No Typology Code available for payment source | Admitting: Physical Therapy

## 2021-03-26 ENCOUNTER — Encounter: Payer: No Typology Code available for payment source | Admitting: Physical Therapy

## 2021-04-02 ENCOUNTER — Encounter: Payer: No Typology Code available for payment source | Admitting: Physical Therapy

## 2021-04-09 ENCOUNTER — Encounter: Payer: No Typology Code available for payment source | Admitting: Physical Therapy

## 2021-06-11 ENCOUNTER — Encounter (HOSPITAL_BASED_OUTPATIENT_CLINIC_OR_DEPARTMENT_OTHER): Payer: Self-pay | Admitting: *Deleted

## 2021-06-11 ENCOUNTER — Emergency Department (HOSPITAL_BASED_OUTPATIENT_CLINIC_OR_DEPARTMENT_OTHER): Payer: No Typology Code available for payment source

## 2021-06-11 ENCOUNTER — Emergency Department (HOSPITAL_BASED_OUTPATIENT_CLINIC_OR_DEPARTMENT_OTHER)
Admission: EM | Admit: 2021-06-11 | Discharge: 2021-06-12 | Disposition: A | Discharge status: Home / Self Care | Payer: No Typology Code available for payment source | Attending: Emergency Medicine | Admitting: Emergency Medicine

## 2021-06-11 ENCOUNTER — Other Ambulatory Visit: Payer: Self-pay

## 2021-06-11 DIAGNOSIS — R0789 Other chest pain: Secondary | ICD-10-CM | POA: Insufficient documentation

## 2021-06-11 DIAGNOSIS — R531 Weakness: Secondary | ICD-10-CM | POA: Insufficient documentation

## 2021-06-11 DIAGNOSIS — Z5321 Procedure and treatment not carried out due to patient leaving prior to being seen by health care provider: Secondary | ICD-10-CM | POA: Insufficient documentation

## 2021-06-11 LAB — BASIC METABOLIC PANEL
Anion gap: 5 (ref 5–15)
BUN: 18 mg/dL (ref 6–20)
CO2: 29 mmol/L (ref 22–32)
Calcium: 8.9 mg/dL (ref 8.9–10.3)
Chloride: 105 mmol/L (ref 98–111)
Creatinine, Ser: 1.39 mg/dL — ABNORMAL HIGH (ref 0.61–1.24)
GFR, Estimated: >60 mL/min (ref >60)
Glucose, Bld: 97 mg/dL (ref 70–99)
Potassium: 3.9 mmol/L (ref 3.5–5.1)
Sodium: 139 mmol/L (ref 135–145)

## 2021-06-11 LAB — CBC
HCT: 47.4 % (ref 39.0–52.0)
Hemoglobin: 15.6 g/dL (ref 13.0–17.0)
MCH: 28.9 pg (ref 26.0–34.0)
MCHC: 32.9 g/dL (ref 30.0–36.0)
MCV: 87.8 fL (ref 80.0–100.0)
Platelets: 205 10*3/uL (ref 150–400)
RBC: 5.4 MIL/uL (ref 4.22–5.81)
RDW: 14.4 % (ref 11.5–15.5)
WBC: 4.8 10*3/uL (ref 4.0–10.5)
nRBC: 0 % (ref 0.0–0.2)

## 2021-06-11 LAB — TROPONIN I (HIGH SENSITIVITY): Troponin I (High Sensitivity): 4 ng/L (ref <18)

## 2021-06-11 NOTE — ED Triage Notes (Signed)
Chest tightness and weakness in his left arm today. Some tightness yesterday. EKG at triage.

## 2021-07-07 NOTE — Progress Notes (Signed)
Cardiology Office Note:   Date:  07/09/2021  NAME:  Lee Reynolds    MRN: [id number] DOB:  [birth year]   PCP:  [name], MD  Cardiologist:  None  Electrophysiologist:  None   Referring MD: [name], *   Chief Complaint  Patient presents with   Chest Pain   History of Present Illness:   Lee Reynolds is a 50 y.o. male with a hx of asthma, GERD who is being seen today for the evaluation of chest pain at the request of [name], MD. Seen in ER 06/11/2021 for CP. Negative EKG and negative enzymes. Follow-up today. He reports he had chest pain on and off for the past 2 years.  Described as tightness in his chest.  He reports an association with food.  Also stress can trigger his symptoms.  Symptoms can last hours.  He reports that he has noticed grapes have triggered the pain.  He reports exertion does not help it and stopping activity does not alleviate it.  He has a medical history significant for hyperlipidemia that is controlled on Lipitor.  He has never had a heart attack or stroke.  He reports heart disease in his maternal grandmother.  He is a former smoker but this was loosely.  No alcohol or drug use is reported.  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  He reports work is stressful.  He has adult children as well as an 52-year-old.  He reports that it stressful.  His EKG in office demonstrates sinus rhythm with no acute ischemic changes or evidence of infarction.  Recent TSH is normal value 1.4.  His most recent lipid profile shows total cholesterol 161, HDL 37, LDL 105, triglycerides 101.  He is morbidly obese with a BMI of 40.  He reports he did lose weight but has gained it back.  Not exercising currently but plans to start.  Symptoms are occurring daily and not improving.  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  Does not appear to help.  He had imaging with Protonix.  May need to try Nexium.  Past Medical History: Past Medical  History:  Diagnosis Date   Asthma 10/11/2018   GERD (gastroesophageal reflux disease) 10/11/2018   Gout 10/11/2018   Headache    Hypercholesterolemia 10/11/2018   Hyperlipemia    Right hand pain 06/24/2018   Trigger finger of right thumb 06/24/2018    Past Surgical History: Past Surgical History:  Procedure Laterality Date   BACK SURGERY  10/2008   L4,L5 discectomy    Current Medications: Current Meds  Medication Sig   Albuterol Sulfate 108 (90 Base) MCG/ACT AEPB Inhale 1 puff into the lungs as needed.   allopurinol (ZYLOPRIM) 300 MG tablet TAKE 1 TABLET BY MOUTH ONCE DAILY FOR 90 DAYS   atorvastatin (LIPITOR) 20 MG tablet atorvastatin 20 mg tablet  take 1 tablet by mouth once daily   cyclobenzaprine (FLEXERIL) 10 MG tablet Take 1 tablet by mouth 3 (three) times daily as needed.   indomethacin (INDOCIN) 25 MG capsule Take 1 capsule by mouth 2 (two) times daily.   loratadine (CLARITIN) 10 MG tablet Take 10 mg by mouth daily.   metoprolol tartrate (LOPRESSOR) 100 MG tablet Take 1 tablet by mouth once for procedure.   omeprazole (PRILOSEC) 20 MG capsule Take 20 mg by mouth daily.     Allergies:    Protonix [pantoprazole]   Social History: Social History  Socioeconomic History   Marital status: Married    Spouse name: Cala Bradford   Number of children: 3   Years of education: Not on file   Highest education level: Bachelor's degree (e.g., BA, AB, BS)  Occupational History   Occupation: Psychologist, sport and exercise Home health Daycare  Tobacco Use   Smoking status: Former    Types: Cigars    Quit date: 2021    Years since quitting: 1.6   Smokeless tobacco: Never   Tobacco comments:    occasionally  Vaping Use   Vaping Use: Never used  Substance and Sexual Activity   Alcohol use: Never   Drug use: Never   Sexual activity: Not on file  Other Topics Concern   Not on file  Social History Narrative   Lives with wife   Caffeine- none   Social Determinants of Health   Financial  Resource Strain: Not on file  Food Insecurity: Not on file  Transportation Needs: Not on file  Physical Activity: Not on file  Stress: Not on file  Social Connections: Not on file     Family History: The patient's family history includes Cancer in his father; Heart disease in his maternal grandmother; Hyperlipidemia in his mother.  ROS:   All other ROS reviewed and negative. Pertinent positives noted in the HPI.     EKGs/Labs/Other Studies Reviewed:   The following studies were personally reviewed by me today:  EKG:  EKG is ordered today.  The ekg ordered today demonstrates normal sinus rhythm heart rate 70, no acute ischemic changes or evidence of infarction, and was personally reviewed by me.   Recent Labs: 06/11/2021: BUN 18; Creatinine, Ser 1.39; Hemoglobin 15.6; Platelets 205; Potassium 3.9; Sodium 139   Recent Lipid Panel No results found for: CHOL, TRIG, HDL, CHOLHDL, VLDL, LDLCALC, LDLDIRECT  Physical Exam:   VS:  BP 114/86 (BP Location: Left Arm, Patient Position: Sitting, Cuff Size: Large)   Pulse 70   Ht 6' 1.5" (1.867 m)   Wt (!) 310 lb 6.4 oz (140.8 kg)   SpO2 99%   BMI 40.40 kg/m    Wt Readings from Last 3 Encounters:  07/09/21 (!) 310 lb 6.4 oz (140.8 kg)  06/11/21 300 lb (136.1 kg)  04/16/20 (!) 321 lb (145.6 kg)    General: Well nourished, well developed, in no acute distress Head: Atraumatic, normal size  Eyes: PEERLA, EOMI  Neck: Supple, no JVD Endocrine: No thryomegaly Cardiac: Normal S1, S2; RRR; no murmurs, rubs, or gallops Lungs: Clear to auscultation bilaterally, no wheezing, rhonchi or rales  Abd: Soft, nontender, no hepatomegaly  Ext: No edema, pulses 2+ Musculoskeletal: No deformities, BUE and BLE strength normal and equal Skin: Warm and dry, no rashes   Neuro: Alert and oriented to person, place, time, and situation, CNII-XII grossly intact, no focal deficits  Psych: Normal mood and affect   ASSESSMENT:   Lee Reynolds is a 50 y.o. male  who presents for the following: 1. Chest pain, unspecified type   2. Obesity, morbid, BMI 40.0-49.9 (HCC)   3. Mixed hyperlipidemia     PLAN:   1. Chest pain, unspecified type -Atypical chest pain.  EKG nonischemic.  Symptoms more consistent with acid reflux.  CVD risk include obesity and hyperlipidemia.  He also has a family history of heart disease.  We will proceed with coronary CTA to exclude CAD.  This includes a calcium score for further restratification.  He will take 100 mg metoprolol tartrate 2 hours for  scan.  We will need a BMP today.  He has no symptoms of shortness of breath.  Cardiovascular examination really unremarkable.  Follow-up will be dictated by scan.  Would recommend further titration of acid reducing agents per primary care physician.  Overall seems to be at goal for other numbers.  2. Obesity, morbid, BMI 40.0-49.9 (HCC) -Diet and exercise recommended.  3. Mixed hyperlipidemia -LDL acceptable.  Continue Lipitor 20.  Further titration pending coronary CTA.  Disposition: Return if symptoms worsen or fail to improve.  Medication Adjustments/Labs and Tests Ordered: Current medicines are reviewed at length with the patient today.  Concerns regarding medicines are outlined above.  Orders Placed This Encounter  Procedures   CT CORONARY MORPH W/CTA COR W/SCORE W/CA W/CM &/OR WO/CM   Basic metabolic panel   EKG 12-Lead    Meds ordered this encounter  Medications   metoprolol tartrate (LOPRESSOR) 100 MG tablet    Sig: Take 1 tablet by mouth once for procedure.    Dispense:  1 tablet    Refill:  0     Patient Instructions  Medication Instructions:  Take Metoprolol Tartrate 100 mg two hours before the CT scan when scheduled.   *If you need a refill on your cardiac medications before your next appointment, please call your pharmacy*   Lab Work: BMET today   If you have labs (blood work) drawn today and your tests are completely normal, you will receive your  results only by: MyChart Message (if you have MyChart) OR A paper copy in the mail If you have any lab test that is abnormal or we need to change your treatment, we will call you to review the results.   Testing/Procedures: Your physician has requested that you have cardiac CT. Cardiac computed tomography (CT) is a painless test that uses an x-ray machine to take clear, detailed pictures of your heart. For further information please visit [email or web address]. Please follow instruction sheet as given.    Follow-Up: At [facility], you and your health needs are our priority.  As part of our continuing mission to provide you with exceptional heart care, we have created designated Provider Care Teams.  These Care Teams include your primary Cardiologist (physician) and Advanced Practice Providers (APPs -  Physician Assistants and Nurse Practitioners) who all work together to provide you with the care you need, when you need it.  We recommend signing up for the patient portal called "MyChart".  Sign up information is provided on this After Visit Summary.  MyChart is used to connect with patients for Virtual Visits (Telemedicine).  Patients are able to view lab/test results, encounter notes, upcoming appointments, etc.  Non-urgent messages can be sent to your provider as well.   To learn more about what you can do with MyChart, go to [email or web address].    Your next appointment:   As needed (based on upcoming results)  The format for your next appointment:   In Person  Provider:   Lennie OdorWesley O'Neal, MD   Other Instructions   Your cardiac CT will be scheduled at one of the below locations:   [facility] [address] [place], [place] [address] [phone]   If scheduled at [facility], please arrive at the [facility] main entrance (entrance A) of [facility] 30 minutes prior to test start time. Proceed to the [facility] Radiology Department (first  floor) to check-in and test prep.  Please follow these instructions carefully (unless otherwise directed):  Hold all erectile dysfunction medications at least 3 days (72 hrs) prior to test.  On the Night Before the Test: Be sure to Drink plenty of water. Do not consume any caffeinated/decaffeinated beverages or chocolate 12 hours prior to your test. Do not take any antihistamines 12 hours prior to your test.  On the Day of the Test: Drink plenty of water until 1 hour prior to the test. Do not eat any food 4 hours prior to the test. You may take your regular medications prior to the test.  Take metoprolol (Lopressor) two hours prior to test. HOLD Furosemide/Hydrochlorothiazide morning of the test.  After the Test: Drink plenty of water. After receiving IV contrast, you may experience a mild flushed feeling. This is normal. On occasion, you may experience a mild rash up to 24 hours after the test. This is not dangerous. If this occurs, you can take Benadryl 25 mg and increase your fluid intake. If you experience trouble breathing, this can be serious. If it is severe call 911 IMMEDIATELY. If it is mild, please call our office. If you take any of these medications: Glipizide/Metformin, Avandament, Glucavance, please do not take 48 hours after completing test unless otherwise instructed.  Please allow 2-4 weeks for scheduling of routine cardiac CTs. Some insurance companies require a pre-authorization which may delay scheduling of this test.   For non-scheduling related questions, please contact the cardiac imaging nurse navigator should you have any questions/concerns: Rockwell Alexandria, Cardiac Imaging Nurse Navigator Larey Brick, Cardiac Imaging Nurse Navigator [facility] Direct Office Dial: [phone]   For scheduling needs, including cancellations and rescheduling, please call [place], [phone].     Signed, Lenna Gilford. Flora Lipps, MD, [facility]  [facility]  [address], [address] [place], [place] [address] [phone]  07/09/2021 10:40 AM

## 2021-07-09 ENCOUNTER — Other Ambulatory Visit: Payer: Self-pay

## 2021-07-09 ENCOUNTER — Encounter: Payer: Self-pay | Admitting: Cardiovascular Disease

## 2021-07-09 ENCOUNTER — Ambulatory Visit (INDEPENDENT_AMBULATORY_CARE_PROVIDER_SITE_OTHER): Payer: No Typology Code available for payment source | Admitting: Cardiovascular Disease

## 2021-07-09 VITALS — BP 114/86 | HR 70 | Ht 73.5 in | Wt 310.4 lb

## 2021-07-09 DIAGNOSIS — R079 Chest pain, unspecified: Secondary | ICD-10-CM

## 2021-07-09 DIAGNOSIS — E782 Mixed hyperlipidemia: Secondary | ICD-10-CM

## 2021-07-09 MED ORDER — METOPROLOL TARTRATE 100 MG PO TABS: ORAL_TABLET | ORAL | 0 refills | Status: DC

## 2021-07-09 NOTE — Patient Instructions (Signed)
Medication Instructions:  Take Metoprolol Tartrate 100 mg two hours before the CT scan when scheduled.   *If you need a refill on your cardiac medications before your next appointment, please call your pharmacy*   Lab Work: BMET today   If you have labs (blood work) drawn today and your tests are completely normal, you will receive your results only by: MyChart Message (if you have MyChart) OR A paper copy in the mail If you have any lab test that is abnormal or we need to change your treatment, we will call you to review the results.   Testing/Procedures: Your physician has requested that you have cardiac CT. Cardiac computed tomography (CT) is a painless test that uses an x-ray machine to take clear, detailed pictures of your heart. For further information please visit [email or web address] Please follow instruction sheet as given.    Follow-Up: At [facility], you and your health needs are our priority.  As part of our continuing mission to provide you with exceptional heart care, we have created designated Provider Care Teams.  These Care Teams include your primary Cardiologist (physician) and Advanced Practice Providers (APPs -  Physician Assistants and Nurse Practitioners) who all work together to provide you with the care you need, when you need it.  We recommend signing up for the patient portal called "MyChart".  Sign up information is provided on this After Visit Summary.  MyChart is used to connect with patients for Virtual Visits (Telemedicine).  Patients are able to view lab/test results, encounter notes, upcoming appointments, etc.  Non-urgent messages can be sent to your provider as well.   To learn more about what you can do with MyChart, go to [email or web address].    Your next appointment:   As needed (based on upcoming results)  The format for your next appointment:   In Person  Provider:   Lennie Odor, MD   Other Instructions   Your cardiac CT will be  scheduled at one of the below locations:   [facility] [address] [place], [place] [address] [phone]   If scheduled at [facility], please arrive at the [facility] main entrance (entrance A) of [facility] 30 minutes prior to test start time. Proceed to the [facility] Radiology Department (first floor) to check-in and test prep.  Please follow these instructions carefully (unless otherwise directed):  Hold all erectile dysfunction medications at least 3 days (72 hrs) prior to test.  On the Night Before the Test: Be sure to Drink plenty of water. Do not consume any caffeinated/decaffeinated beverages or chocolate 12 hours prior to your test. Do not take any antihistamines 12 hours prior to your test.  On the Day of the Test: Drink plenty of water until 1 hour prior to the test. Do not eat any food 4 hours prior to the test. You may take your regular medications prior to the test.  Take metoprolol (Lopressor) two hours prior to test. HOLD Furosemide/Hydrochlorothiazide morning of the test.  After the Test: Drink plenty of water. After receiving IV contrast, you may experience a mild flushed feeling. This is normal. On occasion, you may experience a mild rash up to 24 hours after the test. This is not dangerous. If this occurs, you can take Benadryl 25 mg and increase your fluid intake. If you experience trouble breathing, this can be serious. If it is severe call 911 IMMEDIATELY. If it is mild, please call our office. If you take any  of these medications: Glipizide/Metformin, Avandament, Glucavance, please do not take 48 hours after completing test unless otherwise instructed.  Please allow 2-4 weeks for scheduling of routine cardiac CTs. Some insurance companies require a pre-authorization which may delay scheduling of this test.   For non-scheduling related questions, please contact the cardiac imaging nurse navigator should you have any  questions/concerns: Marchia Bond, Cardiac Imaging Nurse Navigator Gordy Clement, Cardiac Imaging Nurse Navigator [facility] Direct Office Dial: [phone]   For scheduling needs, including cancellations and rescheduling, please call [place], [phone].

## 2021-07-10 LAB — BASIC METABOLIC PANEL
BUN/Creatinine Ratio: 9 (ref 9–20)
BUN: 11 mg/dL (ref 6–24)
CO2: 26 mmol/L (ref 20–29)
Calcium: 9.3 mg/dL (ref 8.7–10.2)
Chloride: 100 mmol/L (ref 96–106)
Creatinine, Ser: 1.23 mg/dL (ref 0.76–1.27)
Glucose: 86 mg/dL (ref 65–99)
Potassium: 4.7 mmol/L (ref 3.5–5.2)
Sodium: 139 mmol/L (ref 134–144)
eGFR: 71 mL/min/{1.73_m2} (ref >59)

## 2021-07-16 ENCOUNTER — Telehealth (HOSPITAL_COMMUNITY): Payer: Self-pay | Admitting: Emergency Medicine

## 2021-07-16 NOTE — Telephone Encounter (Signed)
Reaching out to patient to offer assistance regarding upcoming cardiac imaging study; pt verbalizes understanding of appt date/time, parking situation and where to check in, pre-test NPO status and medications ordered, and verified current allergies; name and call back number provided for further questions should they arise   RN Navigator Cardiac Imaging [facility] [phone] office [phone] cell   Denies iv issues Denies claustro 100mg metoprolol   

## 2021-07-18 ENCOUNTER — Other Ambulatory Visit: Payer: Self-pay

## 2021-07-18 ENCOUNTER — Ambulatory Visit (HOSPITAL_COMMUNITY)
Admission: RE | Admit: 2021-07-18 | Discharge: 2021-07-18 | Disposition: A | Discharge status: Home / Self Care | Payer: No Typology Code available for payment source | Source: Ambulatory Visit | Attending: Cardiovascular Disease | Admitting: Cardiovascular Disease

## 2021-07-18 DIAGNOSIS — R079 Chest pain, unspecified: Secondary | ICD-10-CM | POA: Insufficient documentation

## 2021-07-18 MED ORDER — NITROGLYCERIN 0.4 MG SL SUBL
0.8000 mg | SUBLINGUAL_TABLET | Freq: Once | SUBLINGUAL | Status: AC
  Administered 2021-07-18: 0.8 mg via SUBLINGUAL

## 2021-07-18 MED ORDER — NITROGLYCERIN 0.4 MG SL SUBL
SUBLINGUAL_TABLET | SUBLINGUAL | Status: AC
  Filled 2021-07-18: qty 2

## 2021-07-18 MED ORDER — IOHEXOL 350 MG/ML SOLN
95.0000 mL | Freq: Once | INTRAVENOUS | Status: AC | PRN
  Administered 2021-07-18: 95 mL via INTRAVENOUS

## 2021-07-24 ENCOUNTER — Other Ambulatory Visit: Payer: Self-pay

## 2021-07-24 MED ORDER — ATORVASTATIN CALCIUM 40 MG PO TABS: 40.0000 mg | ORAL_TABLET | Freq: Every day | ORAL | 1 refills | Status: DC

## 2021-12-20 NOTE — Progress Notes (Deleted)
Cardiology Office Note:   Date:  12/20/2021  NAME:  Lee Reynolds    MRN: [id number] DOB:  1971-08-14   PCP:  [name], MD  Cardiologist:  None  Electrophysiologist:  None   Referring MD: [name], *   No chief complaint on file. ***  History of Present Illness:   Lee Reynolds is a 51 y.o. male with a hx of CAD, hyperlipidemia who presents for follow-up.  Evaluated for chest pain in 2022.  Coronary CTA with nonobstructive CAD.  Coronary calcium score in the 82nd percentile.  Problem List CAD -25-49% pLAD -CAC 60 (82nd percentile) 2. HLD -T chol 161, HDL 37, LDL 105, TG 101 3. Obesity  -BMI 40  Past Medical History: Past Medical History:  Diagnosis Date   Asthma 10/11/2018   GERD (gastroesophageal reflux disease) 10/11/2018   Gout 10/11/2018   Headache    Hypercholesterolemia 10/11/2018   Hyperlipemia    Right hand pain 06/24/2018   Trigger finger of right thumb 06/24/2018    Past Surgical History: Past Surgical History:  Procedure Laterality Date   BACK SURGERY  10/2008   L4,L5 discectomy    Current Medications: No outpatient medications have been marked as taking for the 12/24/21 encounter (Appointment) with Sande Rives, MD.     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  Tobacco Use   Smoking status: Former    Types: Cigars    Quit date: 2021    Years since quitting: 2.0   Smokeless tobacco: Never   Tobacco comments:    occasionally  Vaping Use   Vaping Use: Never used  Substance and Sexual Activity   Alcohol use: Never   Drug use: Never   Sexual activity: Not on file  Other Topics Concern   Not on file  Social  History Narrative   Lives with wife   Caffeine- none   Social Determinants of Health   Financial Resource Strain: Not on file  Food Insecurity: Not on file  Transportation Needs: Not on file  Physical Activity: Not on file  Stress: Not on file  Social Connections: Not on file     Family History: The patient's ***family history includes Cancer in his father; Heart disease in his maternal grandmother; Hyperlipidemia in his mother.  ROS:   All other ROS reviewed and negative. Pertinent positives noted in the HPI.     EKGs/Labs/Other Studies Reviewed:   The following studies were personally reviewed by me today:  EKG:  EKG is *** ordered today.  The ekg ordered today demonstrates ***, and was personally reviewed by me.   Recent Labs: 06/11/2021: Hemoglobin 15.6; Platelets 205 07/09/2021: BUN 11; Creatinine, Ser 1.23; Potassium 4.7; Sodium 139   Recent Lipid Panel No results found for: CHOL, TRIG, HDL, CHOLHDL, VLDL, LDLCALC, LDLDIRECT  Physical Exam:   VS:  There were no vitals taken for this visit.   Wt Readings from Last 3 Encounters:  07/09/21 (!) 310 lb 6.4 oz (140.8 kg)  06/11/21 300 lb (136.1 kg)  04/16/20 (!) 321 lb (145.6 kg)    General: Well nourished, well developed, in no acute distress Head: Atraumatic,  normal size  Eyes: PEERLA, EOMI  Neck: Supple, no JVD Endocrine: No thryomegaly Cardiac: Normal S1, S2; RRR; no murmurs, rubs, or gallops Lungs: Clear to auscultation bilaterally, no wheezing, rhonchi or rales  Abd: Soft, nontender, no hepatomegaly  Ext: No edema, pulses 2+ Musculoskeletal: No deformities, BUE and BLE strength normal and equal Skin: Warm and dry, no rashes   Neuro: Alert and oriented to person, place, time, and situation, CNII-XII grossly intact, no focal deficits  Psych: Normal mood and affect   ASSESSMENT:   Lee Reynolds is a 51 y.o. male who presents for the following: No diagnosis found.  PLAN:   There are no diagnoses linked to  this encounter.  {Are you ordering a CV Procedure (e.g. stress test, cath, DCCV, TEE, etc)?   Press F2        :[id number]}  Disposition: No follow-ups on file.  Medication Adjustments/Labs and Tests Ordered: Current medicines are reviewed at length with the patient today.  Concerns regarding medicines are outlined above.  No orders of the defined types were placed in this encounter.  No orders of the defined types were placed in this encounter.   There are no Patient Instructions on file for this visit.   Signed, Lenna Gilford. Flora Lipps, MD, [facility]   [facility]  [address] [place], [place] [address] [phone]  12/20/2021 4:39 PM

## 2021-12-24 ENCOUNTER — Ambulatory Visit (HOSPITAL_BASED_OUTPATIENT_CLINIC_OR_DEPARTMENT_OTHER): Payer: No Typology Code available for payment source | Admitting: Cardiovascular Disease

## 2021-12-24 DIAGNOSIS — I2510 Atherosclerotic heart disease of native coronary artery without angina pectoris: Secondary | ICD-10-CM

## 2021-12-24 DIAGNOSIS — E782 Mixed hyperlipidemia: Secondary | ICD-10-CM

## 2021-12-24 DIAGNOSIS — R931 Abnormal findings on diagnostic imaging of heart and coronary circulation: Secondary | ICD-10-CM

## 2022-01-13 NOTE — Progress Notes (Signed)
Cardiology Office Note:   Date:  01/15/2022  NAME:  ANIELLO Reynolds    MRN: [id number] DOB:  June 02, 1971   PCP:  Glenis Smoker, MD  Cardiologist:  None  Electrophysiologist:  None   Referring MD: Glenis Smoker, *   Chief Complaint  Patient presents with   Follow-up   History of Present Illness:   Lee Reynolds is a 51 y.o. male with a hx of non-obstructive CAD who presents for follow-up.  We discussed Lee results of his coronary CTA.  He has nonobstructive CAD.  He does Lee need aspirin.  He is on Lipitor 40 mg.  We increased his dose.  He is Lee fasting today.  He will have a wellness check in Lee next 1 to 2 months with his primary care physician.  Blood pressure is well controlled.  He has lost 5 to 10 pound since her last visit.  He was counseled to lose more weight.  He has plans to change his diet as well as exercise more.  Still having heartburn symptoms.  Occur after eating.  We discussed switching to Nexium.  He is okay to do this.  After this neck step would be to be evaluated by gastroenterology.  He is okay to do this.  Overall seems to be doing quite well.  We discussed heart healthy diet as well as appropriate exercise.  He will work on this.  Problem List CAD -pLAD <50% -calcium score 60.7 (82nd percentile) 2. HLD -T chol 161, HDL 37, LDL 105, TG 101 3. GERD  Past Medical History: Past Medical History:  Diagnosis Date   Asthma 10/11/2018   GERD (gastroesophageal reflux disease) 10/11/2018   Gout 10/11/2018   Headache    Hypercholesterolemia 10/11/2018   Hyperlipemia    Right hand pain 06/24/2018   Trigger finger of right thumb 06/24/2018    Past Surgical History: Past Surgical History:  Procedure Laterality Date   BACK SURGERY  10/2008   L4,L5 discectomy    Current Medications: Current Meds  Medication Sig   Albuterol Sulfate 108 (90 Base) MCG/ACT AEPB Inhale 1 puff into Lee lungs as needed.   allopurinol (ZYLOPRIM) 300 MG tablet TAKE 1  TABLET BY MOUTH ONCE DAILY FOR 90 DAYS   atorvastatin (LIPITOR) 40 MG tablet Take 1 tablet (40 mg total) by mouth daily.   cyclobenzaprine (FLEXERIL) 10 MG tablet Take 1 tablet by mouth 3 (three) times daily as needed.   esomeprazole (NEXIUM) 40 MG capsule Take 1 capsule (40 mg total) by mouth daily at 12 noon.   indomethacin (INDOCIN) 25 MG capsule Take 1 capsule by mouth 2 (two) times daily.   loratadine (CLARITIN) 10 MG tablet Take 10 mg by mouth daily.   metoprolol tartrate (LOPRESSOR) 100 MG tablet Take 1 tablet by mouth once for procedure.   [DISCONTINUED] omeprazole (PRILOSEC) 20 MG capsule Take 20 mg by mouth daily.     Allergies:    Protonix [pantoprazole]   Social History: Social History   Socioeconomic History   Marital status: Married    Spouse name: Joelene Millin   Number of children: 3   Years of education: Lee on file   Highest education level: Bachelor's degree (e.g., BA, AB, BS)  Occupational History   Occupation: Armed forces operational officer [employer]  Tobacco Use   Smoking status: Former    Types: Cigars    Quit date: 2021    Years since quitting: 2.1   Smokeless tobacco: Never  Tobacco comments:    occasionally  Vaping Use   Vaping Use: Never used  Substance and Sexual Activity   Alcohol use: Never   Drug use: Never   Sexual activity: Lee on file  Other Topics Concern   Lee on file  Social History Narrative   Lives with wife   Caffeine- none   Social Determinants of Health   Financial Resource Strain: Lee on file  Food Insecurity: Lee on file  Transportation Needs: Lee on file  Physical Activity: Lee on file  Stress: Lee on file  Social Connections: Lee on file     Family History: Lee patient's family history includes Cancer in his father; Heart disease in his maternal grandmother; Hyperlipidemia in his mother.  ROS:   All other ROS reviewed and negative. Pertinent positives noted in Lee HPI.     EKGs/Labs/Other Studies Reviewed:   Lee  following studies were personally reviewed by me today:  Recent Labs: 06/11/2021: Hemoglobin 15.6; Platelets 205 07/09/2021: BUN 11; Creatinine, Ser 1.23; Potassium 4.7; Sodium 139   Recent Lipid Panel No results found for: CHOL, TRIG, HDL, CHOLHDL, VLDL, LDLCALC, LDLDIRECT  Physical Exam:   VS:  BP 128/82    Pulse 76    Ht 6\' 1"  (1.854 m)    Wt (!) 306 lb 3.2 oz (138.9 kg)    SpO2 100%    BMI 40.40 kg/m    Wt Readings Lee Last 3 Encounters:  01/15/22 (!) 306 lb 3.2 oz (138.9 kg)  07/09/21 (!) 310 lb 6.4 oz (140.8 kg)  06/11/21 300 lb (136.1 kg)    General: Well nourished, well developed, in no acute distress Head: Atraumatic, normal size  Eyes: PEERLA, EOMI  Neck: Supple, no JVD Endocrine: No thryomegaly Cardiac: Normal S1, S2; RRR; no murmurs, rubs, or gallops Lungs: Clear to auscultation bilaterally, no wheezing, rhonchi or rales  Abd: Soft, nontender, no hepatomegaly  Ext: No edema, pulses 2+ Musculoskeletal: No deformities, BUE and BLE strength normal and equal Skin: Warm and dry, no rashes   Neuro: Alert and oriented to person, place, time, and situation, CNII-XII grossly intact, no focal deficits  Psych: Normal mood and affect   ASSESSMENT:   Lee Reynolds is a 51 y.o. male who presents for Lee following: 1. Coronary artery disease involving native coronary artery of native heart without angina pectoris   2. Agatston coronary artery calcium score less than 100   3. Mixed hyperlipidemia   4. Gastroesophageal reflux disease without esophagitis     PLAN:   1. Coronary artery disease involving native coronary artery of native heart without angina pectoris 2. Agatston coronary artery calcium score less than 100 3. Mixed hyperlipidemia -Evaluated for chest pain symptoms.  Coronary CTA with mild CAD in Lee proximal LAD.  Calcium score 82nd percentile.  Symptoms do Lee represent angina.  They are likely heartburn related.  Moving forward I have recommended aggressive LDL  control.  He is currently on Lipitor 40 mg daily which we increased.  Would recommend his LDL cholesterol be less than 70.  He can follow-up with his primary care physician for this.  Suspect he will get to goal based on recent numbers on 40 mg daily.  He does Lee need aspirin.  Lee better risk reductive strategy is LDL lowering agents.  I have recommended regular exercise as well as proper diet.  He will work on this.  His obesity is a big issue.  He understands this.  He has plans  to start Lee exercise regimen as well as to diet more.  4. Gastroesophageal reflux disease without esophagitis -Still having acid reflux symptoms.  This is occurring on Prilosec 20 mg daily.  We will switch him to Nexium 40 mg daily.  He cannot tolerate Protonix.  If still having symptoms which he has most days per week would recommend gastroenterology evaluation.      Disposition: Return if symptoms worsen or fail to improve.  Medication Adjustments/Labs and Tests Ordered: Current medicines are reviewed at length with Lee patient today.  Concerns regarding medicines are outlined above.  No orders of Lee defined types were placed in this encounter.  Meds ordered this encounter  Medications   esomeprazole (NEXIUM) 40 MG capsule    Sig: Take 1 capsule (40 mg total) by mouth daily at 12 noon.    Dispense:  90 capsule    Refill:  1    Patient Instructions  Medication Instructions:  STOP Omeprazole  START Nexium 40 mg daily   *If you need a refill on your cardiac medications before your next appointment, please call your pharmacy*   Follow-Up: At [facility], you and your health needs are our priority.  As part of our continuing mission to provide you with exceptional heart care, we have created designated Provider Care Teams.  These Care Teams include your primary Cardiologist (physician) and Advanced Practice Providers (APPs -  Physician Assistants and Nurse Practitioners) who all work together to provide you  with Lee care you need, when you need Lee.  We recommend signing up for Lee patient portal called "MyChart".  Sign up information is provided on this After Visit Summary.  MyChart is used to connect with patients for Virtual Visits (Telemedicine).  Patients are able to view lab/test results, encounter notes, upcoming appointments, etc.  Non-urgent messages can be sent to your provider as well.   To learn more about what you can do with MyChart, go to [email or web address].    Your next appointment:   As needed  Lee format for your next appointment:   In Person  Provider:   Eleonore Chiquito, MD      Time Spent with Patient: I have spent a total of 35 minutes with patient reviewing hospital notes, telemetry, EKGs, labs and examining Lee patient as well as establishing Lee assessment and plan that was discussed with Lee patient.  > 50% of time was spent in direct patient care.  Signed, Addison Naegeli. Audie Box, MD, [place]  [address], [place], [place] [address] [phone]  01/15/2022 10:36 AM

## 2022-01-15 ENCOUNTER — Ambulatory Visit (INDEPENDENT_AMBULATORY_CARE_PROVIDER_SITE_OTHER): Payer: No Typology Code available for payment source | Admitting: Cardiovascular Disease

## 2022-01-15 ENCOUNTER — Encounter: Payer: Self-pay | Admitting: Cardiovascular Disease

## 2022-01-15 ENCOUNTER — Other Ambulatory Visit: Payer: Self-pay

## 2022-01-15 VITALS — BP 128/82 | HR 76 | Ht 73.0 in | Wt 306.2 lb

## 2022-01-15 DIAGNOSIS — K219 Gastro-esophageal reflux disease without esophagitis: Secondary | ICD-10-CM

## 2022-01-15 DIAGNOSIS — R931 Abnormal findings on diagnostic imaging of heart and coronary circulation: Secondary | ICD-10-CM

## 2022-01-15 DIAGNOSIS — E782 Mixed hyperlipidemia: Secondary | ICD-10-CM

## 2022-01-15 DIAGNOSIS — I2510 Atherosclerotic heart disease of native coronary artery without angina pectoris: Secondary | ICD-10-CM | POA: Diagnosis not present

## 2022-01-15 MED ORDER — ESOMEPRAZOLE MAGNESIUM 40 MG PO CPDR: 40.0000 mg | DELAYED_RELEASE_CAPSULE | Freq: Every day | ORAL | 1 refills | Status: DC

## 2022-01-15 NOTE — Patient Instructions (Signed)
Medication Instructions:  STOP Omeprazole  START Nexium 40 mg daily   *If you need a refill on your cardiac medications before your next appointment, please call your pharmacy*   Follow-Up: At [facility], you and your health needs are our priority.  As part of our continuing mission to provide you with exceptional heart care, we have created designated Provider Care Teams.  These Care Teams include your primary Cardiologist (physician) and Advanced Practice Providers (APPs -  Physician Assistants and Nurse Practitioners) who all work together to provide you with the care you need, when you need it.  We recommend signing up for the patient portal called "MyChart".  Sign up information is provided on this After Visit Summary.  MyChart is used to connect with patients for Virtual Visits (Telemedicine).  Patients are able to view lab/test results, encounter notes, upcoming appointments, etc.  Non-urgent messages can be sent to your provider as well.   To learn more about what you can do with MyChart, go to [email or web address].    Your next appointment:   As needed  The format for your next appointment:   In Person  Provider:   Lennie Odor, MD

## 2022-09-22 ENCOUNTER — Other Ambulatory Visit: Payer: Self-pay | Admitting: Family Medicine

## 2022-09-22 DIAGNOSIS — R7401 Elevation of levels of liver transaminase levels: Secondary | ICD-10-CM

## 2022-10-08 ENCOUNTER — Ambulatory Visit
Admission: RE | Admit: 2022-10-08 | Discharge: 2022-10-08 | Disposition: A | Discharge status: Home / Self Care | Payer: No Typology Code available for payment source | Source: Ambulatory Visit | Attending: Family Medicine | Admitting: Family Medicine

## 2022-10-08 DIAGNOSIS — R7401 Elevation of levels of liver transaminase levels: Secondary | ICD-10-CM

## 2022-10-12 IMAGING — CT CT HEART MORP W/ CTA COR W/ SCORE W/ CA W/CM &/OR W/O CM
4 of 7 series · 8 of 20 positions shown, 9 images · non-contrast
Comparison: None.
COMPARISON: None.

Addendum:
EXAM:
OVER-READ INTERPRETATION  CT CHEST

The following report is an over-read performed by radiologist Dr.
Menelio Ice [REDACTED] on 07/18/2021. This over-read
does not include interpretation of cardiac or coronary anatomy or
pathology. The coronary CTA interpretation by the cardiologist is
attached.
CLINICAL DATA: Chest pain
Cardiac CTA
MEDICATIONS:
Sub lingual nitro. 4mg x 2
TECHNIQUE: The patient was scanned on a Siemens [REDACTED]ice scanner. Gantry
rotation speed was 250 msecs. Collimation was 0.6 mm. A 100 kV
prospective scan was triggered in the ascending thoracic aorta at
35-75% of the R-R interval. Average HR during the scan was 60 bpm.
The 3D data set was interpreted on a dedicated work station using
MPR, MIP and VRT modes. A total of 80cc of contrast was used.

[Series 7: ts diast sharp 73 % · axial · 0.39mm/px · z∈[+1154,+1194]mm · 2 of 301 slices shown]
[im 101/301  lung]
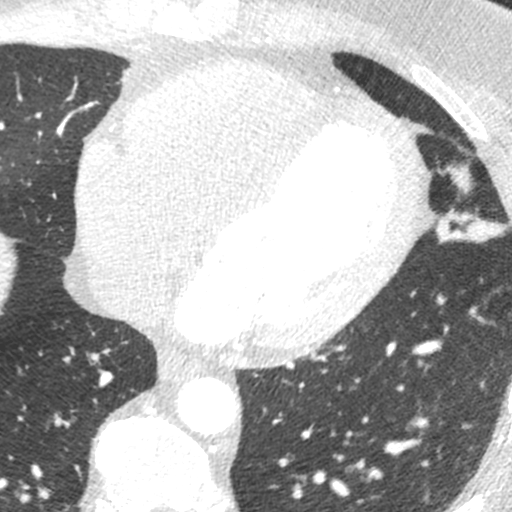
[im 201/301  lung]
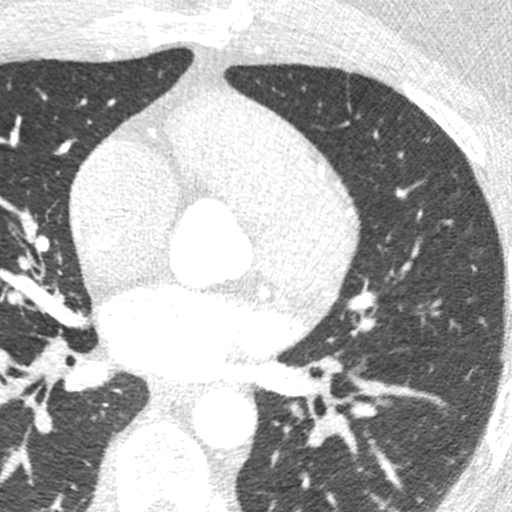

[Series 8: best diast 73 % · axial · 0.39mm/px · z∈[+1154,+1194]mm · 2 of 301 slices shown, 3 images]
[im 101/301  vessel]
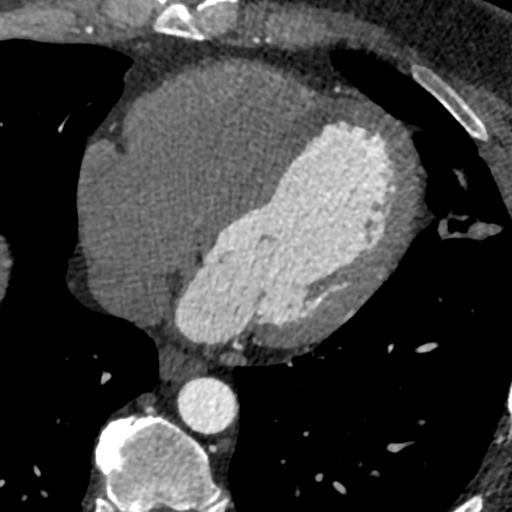
[im 101/301  lung]
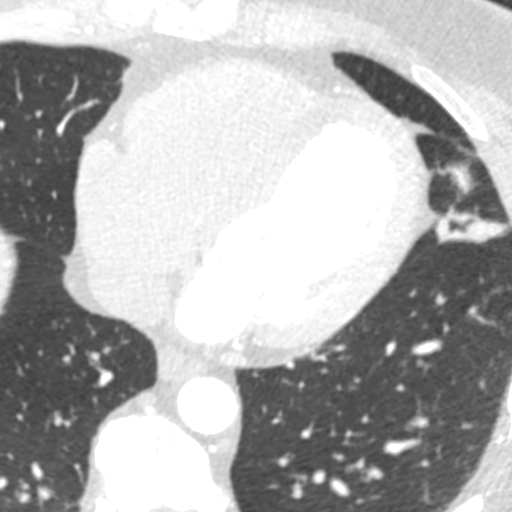
[im 201/301  vessel]
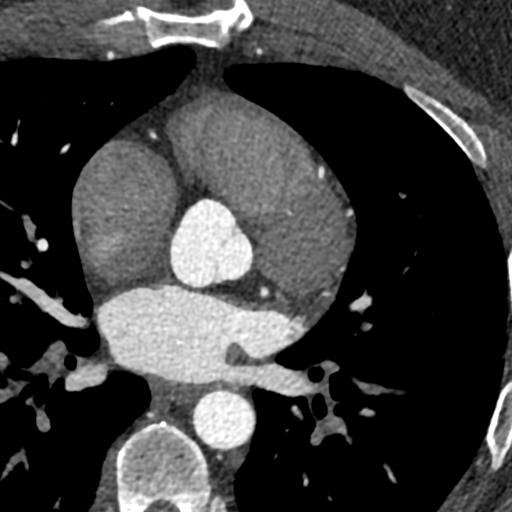

[Series 9: best syst 34 % · axial · 0.39mm/px · z∈[+1154,+1194]mm · 2 of 301 slices shown]
[im 101/301  vessel]
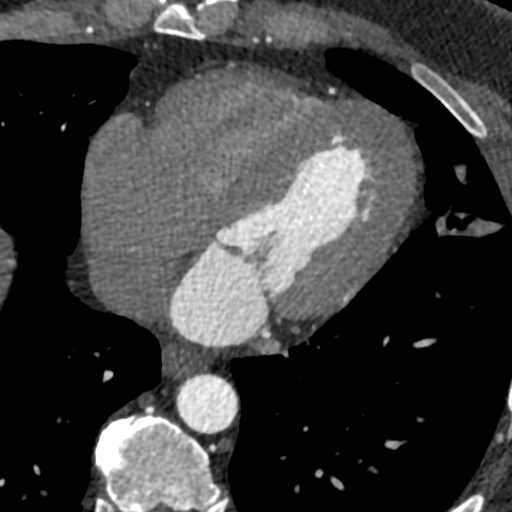
[im 201/301  vessel]
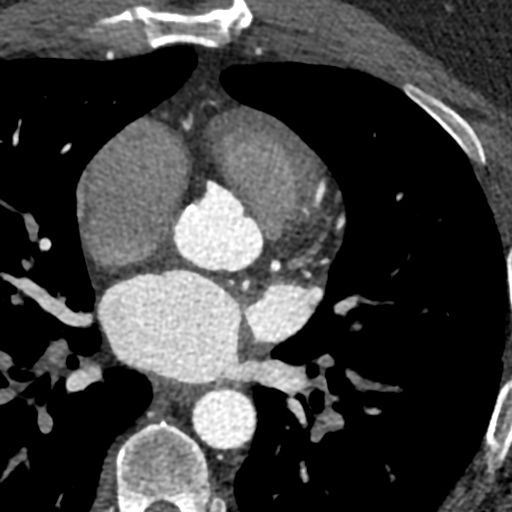

[Series 10: ts syst sharp 34 % · axial · 0.39mm/px · z∈[+1154,+1194]mm · 2 of 301 slices shown]
[im 101/301  lung]
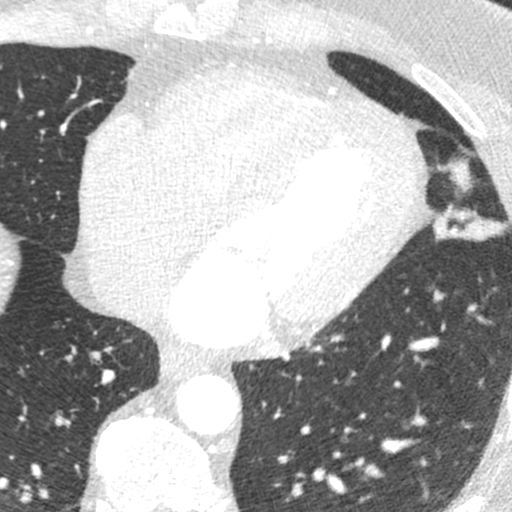
[im 201/301  lung]
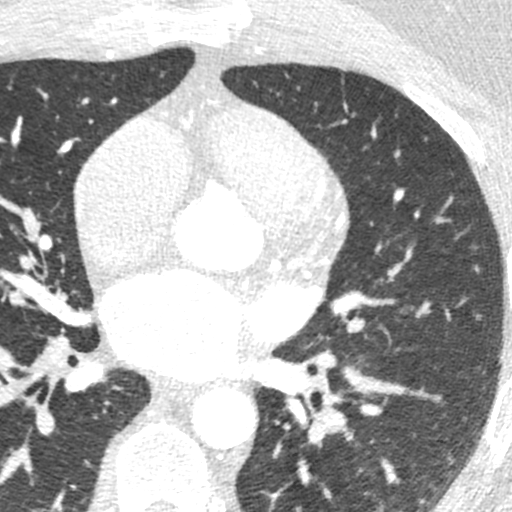

[8 of 20 positions shown; findings below may reference images not displayed]

FINDINGS: Vascular: Heart is normal size.  Aorta normal caliber.

Mediastinum/Nodes: No adenopathy

Lungs/Pleura: Lingular scarring. No additional confluent opacities
or effusions.

Upper Abdomen: Imaging into the upper abdomen demonstrates no acute
findings.

Musculoskeletal: Chest wall soft tissues are unremarkable. No acute
bony abnormality.
IMPRESSION: Lingular scarring.

No acute extra cardiac abnormality.
FINDINGS: Non-cardiac: See separate report from [REDACTED].

No left atrial appendage thrombus. Pulmonary veins drain normally to
the left atrium.

Calcium Score: 60.7 Agatston units.

Coronary Arteries: Left dominant with no anomalies

LM: No plaque or stenosis.

LAD system: Mixed plaque proximal LAD, mild (<50%) stenosis.

Circumflex system: Large, dominant vessel supplies left PDA. No
plaque or stenosis.

RCA system: Small, nondominant RCA with no plaque or stenosis.
IMPRESSION: 1. Coronary artery calcium score 60.7 Agatston units. This places
the patient in the 82nd percentile for age and gender, suggesting
high risk for future cardiac events.

2.  Nonobstructive CAD in the proximal LAD.

Relindas Auad

*** End of Addendum ***
EXAM:
OVER-READ INTERPRETATION  CT CHEST

The following report is an over-read performed by radiologist Dr.
Menelio Ice [REDACTED] on 07/18/2021. This over-read
does not include interpretation of cardiac or coronary anatomy or
pathology. The coronary CTA interpretation by the cardiologist is
attached.
FINDINGS: Vascular: Heart is normal size.  Aorta normal caliber.

Mediastinum/Nodes: No adenopathy

Lungs/Pleura: Lingular scarring. No additional confluent opacities
or effusions.

Upper Abdomen: Imaging into the upper abdomen demonstrates no acute
findings.

Musculoskeletal: Chest wall soft tissues are unremarkable. No acute
bony abnormality.
IMPRESSION: Lingular scarring.

No acute extra cardiac abnormality.

## 2022-11-21 ENCOUNTER — Other Ambulatory Visit: Payer: Self-pay | Admitting: Gastroenterology

## 2022-11-21 DIAGNOSIS — K219 Gastro-esophageal reflux disease without esophagitis: Secondary | ICD-10-CM

## 2022-11-21 DIAGNOSIS — R1319 Other dysphagia: Secondary | ICD-10-CM

## 2022-12-16 ENCOUNTER — Ambulatory Visit
Admission: RE | Admit: 2022-12-16 | Discharge: 2022-12-16 | Disposition: A | Discharge status: Home / Self Care | Payer: No Typology Code available for payment source | Source: Ambulatory Visit | Attending: Gastroenterology | Admitting: Gastroenterology

## 2022-12-16 DIAGNOSIS — K219 Gastro-esophageal reflux disease without esophagitis: Secondary | ICD-10-CM

## 2022-12-16 DIAGNOSIS — R1319 Other dysphagia: Secondary | ICD-10-CM

## 2023-06-09 NOTE — Progress Notes (Signed)
Referring Physician:  [name], MD [address] [place],  [place] [address]  Primary Physician:  [name], MD  History of Present Illness: 06/16/2023 Lee Reynolds has a history of CAD, asthma, GERD, hyperlipidemia, and obesity.    History of lumbar surgery in 2009- L4-L5 decompression.  6 month history of constant LBP with occasional left posterior leg pain to his foot. He has more constant numbness in left foot x 6 months. No weakness in left leg. No right leg pain. Pain is worse with laying down and with activity. He has some relief with flexeril.   Bowel/Bladder Dysfunction: none  Conservative measures:  Physical therapy: none recently Multimodal medical therapy including regular antiinflammatories: flexeril, indocin, motrin Injections: no epidural steroid injections  Past Surgery:  Lumbar surgery in 2009 L4-L5 decompression by Dr. Tomasa Blase has no symptoms of cervical myelopathy.  The symptoms are causing a significant impact on the patient's life.   Review of Systems:  A 10 point review of systems is negative, except for the pertinent positives and negatives detailed in the HPI.  Past Medical History: Past Medical History:  Diagnosis Date   Asthma 10/11/2018   GERD (gastroesophageal reflux disease) 10/11/2018   Gout 10/11/2018   Headache    Hypercholesterolemia 10/11/2018   Hyperlipemia    Right hand pain 06/24/2018   Trigger finger of right thumb 06/24/2018    Past Surgical History: Past Surgical History:  Procedure Laterality Date   BACK SURGERY  10/2008   L4,L5 discectomy    Allergies: Allergies as of 06/16/2023 - Review Complete 06/16/2023  Allergen Reaction Noted   Protonix [pantoprazole] Itching 04/03/2020    Medications: Outpatient Encounter Medications as of 06/16/2023  Medication Sig   Albuterol Sulfate 108 (90 Base) MCG/ACT AEPB Inhale 1 puff into the lungs as needed.   allopurinol (ZYLOPRIM)  300 MG tablet TAKE 1 TABLET BY MOUTH ONCE DAILY FOR 90 DAYS   atorvastatin (LIPITOR) 80 MG tablet Take 80 mg by mouth daily.   cyclobenzaprine (FLEXERIL) 10 MG tablet Take 1 tablet by mouth 3 (three) times daily as needed.   esomeprazole (NEXIUM) 40 MG capsule Take 1 capsule (40 mg total) by mouth daily at 12 noon.   famotidine (PEPCID) 40 MG tablet Take 1 tablet by mouth at bedtime.   fluticasone (FLONASE) 50 MCG/ACT nasal spray Place 1 spray into both nostrils daily.   ibuprofen (ADVIL) 200 MG tablet Take 200 mg by mouth every 6 (six) hours as needed.   indomethacin (INDOCIN) 25 MG capsule Take 1 capsule by mouth 2 (two) times daily.   loratadine (CLARITIN) 10 MG tablet Take 10 mg by mouth daily.   ZEPBOUND 2.5 MG/0.5ML Pen Inject 2.5 mg into the skin once a week.   [DISCONTINUED] atorvastatin (LIPITOR) 40 MG tablet Take 1 tablet (40 mg total) by mouth daily.   [DISCONTINUED] metoprolol tartrate (LOPRESSOR) 100 MG tablet Take 1 tablet by mouth once for procedure. (Patient not taking: Reported on 06/16/2023)   No facility-administered encounter medications on file as of 06/16/2023.    Social History: Social History   Tobacco Use   Smoking status: Former    Types: Cigars    Quit date: 2021    Years since quitting: 3.5   Smokeless tobacco: Never   Tobacco comments:    occasionally  Vaping Use   Vaping status: Never Used  Substance Use Topics   Alcohol use: Never   Drug use: Never  Family Medical History: Family History  Problem Relation Age of Onset   Hyperlipidemia Mother    Cancer Father    Heart disease Maternal Grandmother     Physical Examination: Vitals:   06/16/23 0859  BP: 124/82    General: Patient is well developed, well nourished, calm, collected, and in no apparent distress. Attention to examination is appropriate.  Respiratory: Patient is breathing without any difficulty.   NEUROLOGICAL:     Awake, alert, oriented to person, place, and time.  Speech  is clear and fluent. Fund of knowledge is appropriate.   Cranial Nerves: Pupils equal round and reactive to light.  Facial tone is symmetric.    No posterior lumbar tenderness. Well healed lumbar incision.   No abnormal lesions on exposed skin.   Strength: Side Biceps Triceps Deltoid Interossei Grip Wrist Ext. Wrist Flex.  R 5 5 5 5 5 5 5   L 5 5 5 5 5 5 5    Side Iliopsoas Quads Hamstring PF DF EHL  R 5 5 5 5 5 5   L 5 5 5 5 5 5    Reflexes are 2+ and symmetric at the biceps, brachioradialis, patella and achilles.   Hoffman's is absent.  Clonus is not present.   Bilateral upper and lower extremity sensation is intact to light touch. He notes decreased sensation in left foot compared to right.   Gait is normal.     Medical Decision Making  Imaging: No recent lumbar imaging.   Assessment and Plan: Lee Reynolds is a pleasant 52 y.o. male has history of lumbar surgery in 2009- L4-L5 decompression.  6 month history of constant LBP with occasional left posterior leg pain to his foot. He has more constant numbness in left foot x 6 months.   No lumbar imaging available. LBP and left leg pain appear to be lumbar mediated.   Treatment options discussed with patient and following plan made:   - MRI of lumbar spine to further evaluate left lumbar radiculopathy that developed in last 6 months. No improvement with time or medications.  - Discussed PT for lumbar spine. He wants to revisit after MRI. May also consider lumbar injections.  - Okay to continue on prn flexeril from other providers. Reviewed dosing and side effects. Discussed this can cause drowsiness.  - Will schedule phone visit to review MRI results once I get them back.   I spent a total of 30 minutes in face-to-face and non-face-to-face activities related to this patient's care today including review of outside records, review of imaging, review of symptoms, physical exam, discussion of differential diagnosis, discussion of  treatment options, and documentation.   Thank you for involving me in the care of this patient.   [name] PA-C Dept. of Neurosurgery

## 2023-06-16 ENCOUNTER — Encounter: Payer: Self-pay | Admitting: Orthopedic Surgery

## 2023-06-16 ENCOUNTER — Ambulatory Visit (INDEPENDENT_AMBULATORY_CARE_PROVIDER_SITE_OTHER): Payer: No Typology Code available for payment source | Admitting: Orthopedic Surgery

## 2023-06-16 VITALS — BP 124/82 | Ht 73.0 in | Wt 299.2 lb

## 2023-06-16 DIAGNOSIS — M5442 Lumbago with sciatica, left side: Secondary | ICD-10-CM | POA: Diagnosis not present

## 2023-06-16 DIAGNOSIS — M5416 Radiculopathy, lumbar region: Secondary | ICD-10-CM

## 2023-06-16 DIAGNOSIS — Z9889 Other specified postprocedural states: Secondary | ICD-10-CM | POA: Diagnosis not present

## 2023-06-16 NOTE — Patient Instructions (Signed)
It was so nice to see you today. Thank you so much for coming in.    I think your back and left leg pain are coming from your back.   I want to get an MRI of your lower back to look into things further. We will get this approved through your insurance and [facility] will call you to schedule the appointment.   Okay to continue on cyclobenzaprine as directed to help with muscle spasms. Use only as needed and be careful, this can make you sleepy.   It takes 5-7 days for me to get the MRI results after it is completed. Once I have these results, we will call you to schedule a phone visit with me to review them.   Please do not hesitate to call if you have any questions or concerns. You can also message me in MyChart.   If you have not heard back about the MRI in the next week, please call the office so we can help you get it scheduled.   [name] PA-C [phone]

## 2023-06-24 ENCOUNTER — Ambulatory Visit
Admission: RE | Admit: 2023-06-24 | Discharge: 2023-06-24 | Disposition: A | Discharge status: Home / Self Care | Payer: No Typology Code available for payment source | Source: Ambulatory Visit | Attending: Orthopedic Surgery | Admitting: Orthopedic Surgery

## 2023-06-24 DIAGNOSIS — M5442 Lumbago with sciatica, left side: Secondary | ICD-10-CM

## 2023-06-24 DIAGNOSIS — Z9889 Other specified postprocedural states: Secondary | ICD-10-CM

## 2023-07-23 NOTE — Progress Notes (Addendum)
Telephone Visit- Progress Note: Referring Physician:  [name], MD [address] [place],  [place] [address]  Primary Physician:  Lee Hale, MD  This visit was performed via telephone.  Patient location: home Provider location: office  I spent a total of 8 minutes non-face-to-face activities for this visit on the date of this encounter including review of current clinical condition and response to treatment.    Patient has given verbal consent to this telephone visits and we reviewed the limitations of a telephone visit. Patient wishes to proceed.    Chief Complaint:  review lumbar MRI  History of Present Illness: Lee Reynolds is a 52 y.o. male has a history of CAD, asthma, GERD, hyperlipidemia, and obesity.     History of lumbar surgery in 2009- L4-L5 decompression.  Last seen by me on 06/16/23 for LBP with left leg pain and numbness in left foot.   Phone visit scheduled to review lumbar MRI results.   His LBP is now intermittent and he still has intermittent left posterior leg pain to his foot (has daily). He has more constant numbness in left foot. No weakness. No right leg pain. Pain is worse with activity.   He bought an inversion table and it seems to help.    Bowel/Bladder Dysfunction: none   Conservative measures:  Physical therapy: none recently Multimodal medical therapy including regular antiinflammatories: flexeril, indocin, motrin Injections: no epidural steroid injections   Past Surgery:  Lumbar surgery in 2009 L4-L5 decompression by Dr. Tomasa Reynolds has no symptoms of cervical myelopathy.   The symptoms are causing a significant impact on the patient's life.   Exam: No exam done as this was a telephone encounter.     Imaging: Lumbar MRI dated 06/24/23:  FINDINGS: Segmentation: Lumbar segmentation appears to be normal and is the same numbering system used in 2015.   Alignment: Chronic straightening of  lumbar lordosis. No significant scoliosis or spondylolisthesis.   Vertebrae: Chronic degenerative endplate marrow signal changes at L4-L5, with small new L5 superior endplate Schmorl's node but largely resolved degenerative endplate marrow edema at that level since 2015. Background bone marrow signal is within normal limits. No acute osseous abnormality identified. Intact visible sacrum.   Conus medullaris and cauda equina: Conus extends to the T12-L1 level. No lower spinal cord or conus signal abnormality.   Paraspinal and other soft tissues: Negative aside from mild chronic postoperative changes to the posterior paraspinal soft tissues at L4-L5.   Disc levels:   Visible lower thoracic levels through L1-L2 remain negative.   L2-L3: Disc space loss since 2015 and mild disc desiccation. Mild new circumferential disc bulge. Mild posterior element hypertrophy and epidural lipomatosis. New borderline to mild spinal stenosis (series 105, image 9). And mild to moderate new left L2 neural foraminal stenosis, in part related to broad-based left foraminal disc (series 13, image 17).   L3-L4: Chronic disc desiccation, mild circumferential disc bulging and mild posterior element hypertrophy is stable. Mild epidural lipomatosis at this level is stable to mildly regressed. No spinal stenosis. And borderline to mild mostly left side L3 foraminal stenosis is stable.   L4-L5: Chronic severe disc space loss. Circumferential disc osteophyte complex. Chronic postoperative changes to the left lamina. Mild residual posterior element hypertrophy. No spinal stenosis. Mild to moderate right lateral recess stenosis related to broad-based rightward disc osteophyte complex is new (right L5 nerve level series 113, image 29). Mild left L4 foraminal stenosis is  stable. Moderate to severe right L4 foraminal stenosis appears chronic but progressed on series 105, image 5.   L5-S1: Chronic disc space loss  and circumferential disc osteophyte complex with mild facet hypertrophy. No spinal stenosis. No convincing lateral recess stenosis. And mild left greater than right L5 foraminal stenosis appears stable since 2015.   IMPRESSION: 1. The symptomatic level with regard to left side radiating pain might be L2-L3, where there is up to mild new multifactorial spinal stenosis and mild to moderate new left foraminal stenosis since the 2015 MRI related to broad-based disc and endplate spurring. Query left L2 radiculitis. 2. Otherwise chronic postoperative changes at L4-L5 with new and/or increased right lateral recess and right foraminal stenosis at that level (moderate to severe). 3. And essentially stable lumbar spine degeneration elsewhere. Mild left L3, L4, and left greater than right L5 neural foraminal stenosis.     Electronically Signed   By: Lee Reynolds M.D.   On: 07/04/2023 14:25    I have personally reviewed the images and agree with the above interpretation.  Assessment and Plan: [name] is a pleasant 52 y.o. male with a history of lumbar surgery in 2009- L4-L5 decompression.  His LBP is now intermittent (was constant). He also has intermittent left posterior leg pain to his foot (has daily). He has more constant numbness in left foot. No weakness. No right leg pain. Pain is worse with activity.   He has left foraminal disc at L2-L3, but he has no groin pain. He has multilevel foraminal stenosis with DDD L3-S1.   LBP is likely multifactorial and due to underlying DDD/spondylosis. Left leg pain may be due to foraminal stenosis L5-S1. Not sure of cause of left foot numbness.    Treatment options discussed with patient and following plan made:   - Recommend PT for lumbar spine. He declines. Will send lumbar HEP to do on his own.  - Referral to pain management (Lee Reynolds) to consider lumbar injections.  - Okay to continue on prn flexeril from other providers. Reviewed dosing and side  effects. Discussed this can cause drowsiness.  - Will follow numbness in left foot. If no improvement with above, may consider EMG.  - Follow up with me in 6-8 weeks and prn.   Lee Leach PA-C Neurosurgery

## 2023-07-24 ENCOUNTER — Encounter: Payer: Self-pay | Admitting: Orthopedic Surgery

## 2023-07-24 ENCOUNTER — Ambulatory Visit (INDEPENDENT_AMBULATORY_CARE_PROVIDER_SITE_OTHER): Payer: No Typology Code available for payment source | Admitting: Orthopedic Surgery

## 2023-07-24 DIAGNOSIS — M5136 Other intervertebral disc degeneration, lumbar region: Secondary | ICD-10-CM | POA: Diagnosis not present

## 2023-07-24 DIAGNOSIS — M5416 Radiculopathy, lumbar region: Secondary | ICD-10-CM

## 2023-07-24 DIAGNOSIS — Z9889 Other specified postprocedural states: Secondary | ICD-10-CM

## 2023-07-24 DIAGNOSIS — M48061 Spinal stenosis, lumbar region without neurogenic claudication: Secondary | ICD-10-CM

## 2023-07-24 DIAGNOSIS — M47816 Spondylosis without myelopathy or radiculopathy, lumbar region: Secondary | ICD-10-CM

## 2023-07-24 DIAGNOSIS — M4726 Other spondylosis with radiculopathy, lumbar region: Secondary | ICD-10-CM | POA: Diagnosis not present

## 2023-08-01 ENCOUNTER — Other Ambulatory Visit: Payer: Self-pay

## 2023-08-01 ENCOUNTER — Observation Stay (HOSPITAL_COMMUNITY): Payer: No Typology Code available for payment source

## 2023-08-01 ENCOUNTER — Other Ambulatory Visit (HOSPITAL_BASED_OUTPATIENT_CLINIC_OR_DEPARTMENT_OTHER): Payer: Self-pay

## 2023-08-01 ENCOUNTER — Emergency Department (HOSPITAL_BASED_OUTPATIENT_CLINIC_OR_DEPARTMENT_OTHER): Payer: No Typology Code available for payment source

## 2023-08-01 ENCOUNTER — Encounter (HOSPITAL_BASED_OUTPATIENT_CLINIC_OR_DEPARTMENT_OTHER): Payer: Self-pay | Admitting: Emergency Medicine

## 2023-08-01 ENCOUNTER — Observation Stay (HOSPITAL_BASED_OUTPATIENT_CLINIC_OR_DEPARTMENT_OTHER)
Admission: EM | Admit: 2023-08-01 | Discharge: 2023-08-02 | Disposition: A | Discharge status: Home / Self Care | Payer: No Typology Code available for payment source | Attending: Internal Medicine | Admitting: Internal Medicine

## 2023-08-01 DIAGNOSIS — Z79899 Other long term (current) drug therapy: Secondary | ICD-10-CM | POA: Insufficient documentation

## 2023-08-01 DIAGNOSIS — E669 Obesity, unspecified: Secondary | ICD-10-CM

## 2023-08-01 DIAGNOSIS — G459 Transient cerebral ischemic attack, unspecified: Secondary | ICD-10-CM

## 2023-08-01 DIAGNOSIS — M109 Gout, unspecified: Secondary | ICD-10-CM

## 2023-08-01 DIAGNOSIS — Z7902 Long term (current) use of antithrombotics/antiplatelets: Secondary | ICD-10-CM | POA: Diagnosis not present

## 2023-08-01 DIAGNOSIS — R030 Elevated blood-pressure reading, without diagnosis of hypertension: Secondary | ICD-10-CM

## 2023-08-01 DIAGNOSIS — R7401 Elevation of levels of liver transaminase levels: Secondary | ICD-10-CM

## 2023-08-01 DIAGNOSIS — Z87891 Personal history of nicotine dependence: Secondary | ICD-10-CM | POA: Insufficient documentation

## 2023-08-01 DIAGNOSIS — D72819 Decreased white blood cell count, unspecified: Secondary | ICD-10-CM | POA: Diagnosis not present

## 2023-08-01 DIAGNOSIS — E7800 Pure hypercholesterolemia, unspecified: Secondary | ICD-10-CM | POA: Diagnosis not present

## 2023-08-01 DIAGNOSIS — N182 Chronic kidney disease, stage 2 (mild): Secondary | ICD-10-CM

## 2023-08-01 DIAGNOSIS — Z7982 Long term (current) use of aspirin: Secondary | ICD-10-CM | POA: Diagnosis not present

## 2023-08-01 DIAGNOSIS — J45909 Unspecified asthma, uncomplicated: Secondary | ICD-10-CM | POA: Insufficient documentation

## 2023-08-01 DIAGNOSIS — R531 Weakness: Principal | ICD-10-CM | POA: Insufficient documentation

## 2023-08-01 DIAGNOSIS — Z6830 Body mass index (BMI) 30.0-30.9, adult: Secondary | ICD-10-CM | POA: Diagnosis not present

## 2023-08-01 DIAGNOSIS — R7303 Prediabetes: Secondary | ICD-10-CM | POA: Diagnosis not present

## 2023-08-01 LAB — URINALYSIS, ROUTINE W REFLEX MICROSCOPIC
Bilirubin Urine: NEGATIVE
Glucose, UA: NEGATIVE mg/dL
Hgb urine dipstick: NEGATIVE
Ketones, ur: 15 mg/dL — AB
Leukocytes,Ua: NEGATIVE
Nitrite: NEGATIVE
Specific Gravity, Urine: >1.046 — ABNORMAL HIGH (ref 1.005–1.030)
pH: 5.5 (ref 5.0–8.0)

## 2023-08-01 LAB — COMPREHENSIVE METABOLIC PANEL
ALT: 45 U/L — ABNORMAL HIGH (ref 0–44)
AST: 55 U/L — ABNORMAL HIGH (ref 15–41)
Albumin: 3.7 g/dL (ref 3.5–5.0)
Alkaline Phosphatase: 56 U/L (ref 38–126)
Anion gap: 6 (ref 5–15)
BUN: 20 mg/dL (ref 6–20)
CO2: 25 mmol/L (ref 22–32)
Calcium: 8.8 mg/dL — ABNORMAL LOW (ref 8.9–10.3)
Chloride: 103 mmol/L (ref 98–111)
Creatinine, Ser: 1.25 mg/dL — ABNORMAL HIGH (ref 0.61–1.24)
GFR, Estimated: >60 mL/min (ref >60)
Glucose, Bld: 80 mg/dL (ref 70–99)
Potassium: 4.3 mmol/L (ref 3.5–5.1)
Sodium: 134 mmol/L — ABNORMAL LOW (ref 135–145)
Total Bilirubin: 0.9 mg/dL (ref 0.3–1.2)
Total Protein: 7.2 g/dL (ref 6.5–8.1)

## 2023-08-01 LAB — DIFFERENTIAL
Abs Immature Granulocytes: 0 10*3/uL (ref 0.00–0.07)
Basophils Absolute: 0 10*3/uL (ref 0.0–0.1)
Basophils Relative: 0 %
Eosinophils Absolute: 0.2 10*3/uL (ref 0.0–0.5)
Eosinophils Relative: 6 %
Lymphocytes Relative: 56 %
Lymphs Abs: 1.6 10*3/uL (ref 0.7–4.0)
Monocytes Absolute: 0.2 10*3/uL (ref 0.1–1.0)
Monocytes Relative: 6 %
Neutro Abs: 0.9 10*3/uL — ABNORMAL LOW (ref 1.7–7.7)
Neutrophils Relative %: 32 %
Smear Review: NORMAL

## 2023-08-01 LAB — RAPID URINE DRUG SCREEN, HOSP PERFORMED
Amphetamines: NOT DETECTED
Barbiturates: NOT DETECTED
Benzodiazepines: NOT DETECTED
Cocaine: NOT DETECTED
Opiates: NOT DETECTED
Tetrahydrocannabinol: NOT DETECTED

## 2023-08-01 LAB — CBC WITH DIFFERENTIAL/PLATELET
Abs Immature Granulocytes: 0 10*3/uL (ref 0.00–0.07)
Basophils Absolute: 0 10*3/uL (ref 0.0–0.1)
Basophils Relative: 1 %
Eosinophils Absolute: 0.1 10*3/uL (ref 0.0–0.5)
Eosinophils Relative: 3 %
HCT: 41.6 % (ref 39.0–52.0)
Hemoglobin: 13.6 g/dL (ref 13.0–17.0)
Immature Granulocytes: 0 %
Lymphocytes Relative: 42 %
Lymphs Abs: 1.8 10*3/uL (ref 0.7–4.0)
MCH: 27.9 pg (ref 26.0–34.0)
MCHC: 32.7 g/dL (ref 30.0–36.0)
MCV: 85.4 fL (ref 80.0–100.0)
Monocytes Absolute: 0.4 10*3/uL (ref 0.1–1.0)
Monocytes Relative: 11 %
Neutro Abs: 1.8 10*3/uL (ref 1.7–7.7)
Neutrophils Relative %: 43 %
Platelets: 189 10*3/uL (ref 150–400)
RBC: 4.87 MIL/uL (ref 4.22–5.81)
RDW: 14.3 % (ref 11.5–15.5)
WBC: 4.1 10*3/uL (ref 4.0–10.5)
nRBC: 0 % (ref 0.0–0.2)

## 2023-08-01 LAB — HEMOGLOBIN A1C
Hgb A1c MFr Bld: 5.9 % — ABNORMAL HIGH (ref 4.8–5.6)
Mean Plasma Glucose: 122.63 mg/dL

## 2023-08-01 LAB — CBC
HCT: 43.8 % (ref 39.0–52.0)
Hemoglobin: 14.4 g/dL (ref 13.0–17.0)
MCH: 28.5 pg (ref 26.0–34.0)
MCHC: 32.9 g/dL (ref 30.0–36.0)
MCV: 86.7 fL (ref 80.0–100.0)
Platelets: 199 10*3/uL (ref 150–400)
RBC: 5.05 MIL/uL (ref 4.22–5.81)
RDW: 14.3 % (ref 11.5–15.5)
WBC: 2.8 10*3/uL — ABNORMAL LOW (ref 4.0–10.5)
nRBC: 0 % (ref 0.0–0.2)

## 2023-08-01 LAB — HIV ANTIBODY (ROUTINE TESTING W REFLEX): HIV Screen 4th Generation wRfx: NONREACTIVE

## 2023-08-01 LAB — TROPONIN I (HIGH SENSITIVITY): Troponin I (High Sensitivity): 5 ng/L (ref <18)

## 2023-08-01 MED ORDER — ACETAMINOPHEN 650 MG RE SUPP: 650.0000 mg | RECTAL | Status: DC | PRN

## 2023-08-01 MED ORDER — SENNOSIDES-DOCUSATE SODIUM 8.6-50 MG PO TABS: 1.0000 | ORAL_TABLET | Freq: Every evening | ORAL | Status: DC | PRN

## 2023-08-01 MED ORDER — STROKE: EARLY STAGES OF RECOVERY BOOK
Freq: Once | Status: AC
  Filled 2023-08-01: qty 1

## 2023-08-01 MED ORDER — ACETAMINOPHEN 325 MG PO TABS: 650.0000 mg | ORAL_TABLET | ORAL | Status: DC | PRN

## 2023-08-01 MED ORDER — LACTATED RINGERS IV BOLUS
1000.0000 mL | Freq: Once | INTRAVENOUS | Status: AC
  Administered 2023-08-01: 1000 mL via INTRAVENOUS

## 2023-08-01 MED ORDER — ASPIRIN 81 MG PO TBEC
81.0000 mg | DELAYED_RELEASE_TABLET | Freq: Every day | ORAL | Status: DC
  Administered 2023-08-01 – 2023-08-02 (×2): 81 mg via ORAL
  Filled 2023-08-01 (×2): qty 1

## 2023-08-01 MED ORDER — IOHEXOL 350 MG/ML SOLN
75.0000 mL | Freq: Once | INTRAVENOUS | Status: AC | PRN
  Administered 2023-08-01: 75 mL via INTRAVENOUS

## 2023-08-01 MED ORDER — CLOPIDOGREL BISULFATE 75 MG PO TABS
75.0000 mg | ORAL_TABLET | Freq: Every day | ORAL | Status: DC
  Administered 2023-08-01 – 2023-08-02 (×2): 75 mg via ORAL
  Filled 2023-08-01 (×2): qty 1

## 2023-08-01 MED ORDER — ACETAMINOPHEN 160 MG/5ML PO SOLN: 650.0000 mg | ORAL | Status: DC | PRN

## 2023-08-01 MED ORDER — ENOXAPARIN SODIUM 40 MG/0.4ML IJ SOSY
40.0000 mg | PREFILLED_SYRINGE | INTRAMUSCULAR | Status: DC
  Administered 2023-08-01: 40 mg via SUBCUTANEOUS
  Filled 2023-08-01: qty 0.4

## 2023-08-01 NOTE — ED Notes (Signed)
ED Provider at bedside. 

## 2023-08-01 NOTE — Progress Notes (Signed)
Admission Note:  Pt arrived to [facility] [place] from [place] ED, at approximately 1315. Pt is alert and oriented x4, on room air, and denied pain. NIH on admission to [facility] [place] was 0. Pt is independent and ambulated from stretcher to bed. Pt in NAD at this time, and wife and children are at bedside.

## 2023-08-01 NOTE — ED Triage Notes (Signed)
Pt arrives pov, steady gait c/o LT arm and leg weakness that started at 0730 today. Pt LKW at 0729. Reports elev b/p after weakness started. Pt bilaterally equal, speech clear. VAN -

## 2023-08-01 NOTE — ED Notes (Signed)
Called [facility] for transport spoke with [place]

## 2023-08-01 NOTE — Plan of Care (Signed)

## 2023-08-01 NOTE — Consult Note (Signed)
Neurology Consultation  Reason for Consult: TIA   Referring Physician: Dr. Katrinka Blazing  CC: left-sided weakness  History is obtained from:patient and chart review  HPI: Lee Reynolds is a 52 y.o. male with PMH significant for HLD, asthma, gout, GERD who presented to [facility] ED c/o left-sided weakness. Patient reports waking up in his normal state of health for which she was able to take a shower and around 7:30 AM noticed that the left side was weak.  He was still able to move his left upper and lower extremities, but they felt tingly. Patient takes a baby aspirin daily, no blood thinners. Has been on Mounjaro for the last 3 months, lost 36lbs.  On transfer to [facility], symptoms all resolved. BP slghtly elevated with systolic in the 150s. NIH: 0. CT/CTA negative.  On subsequent exam, NIH 0, no neurological deficits appreciated.  Patient was recently seen by neurosurgery for LBP with left leg pain and numbness in the left foot. Endorsed constant numbness in left foot at that time. Noted to be likely due to underlying DDD/spondylosis and foraminal stenosis L5-S1. He was referred to pain mgmt to consider lumbar injections, placed on PRN flexeril, plan for EMG if numbness continued.    LKW: 0729 TNK given?: no, symptoms resolved IR Thrombectomy? No, resolved symptoms Modified Rankin Scale: 0-Completely asymptomatic and back to baseline post- stroke  ROS: A complete ROS was performed and is negative except as noted in the HPI.    Past Medical History:  Diagnosis Date   Asthma 10/11/2018   GERD (gastroesophageal reflux disease) 10/11/2018   Gout 10/11/2018   Headache    Hypercholesterolemia 10/11/2018   Hyperlipemia    Right hand pain 06/24/2018   Trigger finger of right thumb 06/24/2018    Family History  Problem Relation Age of Onset   Hyperlipidemia Mother    Cancer Father    Heart disease Maternal Grandmother     Social History:   reports that he quit smoking about 3 years ago. His  smoking use included cigars. He has never used smokeless tobacco. He reports that he does not drink alcohol and does not use drugs.  Medications  Current Facility-Administered Medications:    [START ON 08/02/2023]  stroke: early stages of recovery book, , Does not apply, Once, Madelyn Flavors A, MD   acetaminophen (TYLENOL) tablet 650 mg, 650 mg, Oral, Q4H PRN **OR** acetaminophen (TYLENOL) 160 MG/5ML solution 650 mg, 650 mg, Per Tube, Q4H PRN **OR** acetaminophen (TYLENOL) suppository 650 mg, 650 mg, Rectal, Q4H PRN, Katrinka Blazing, Rondell A, MD   aspirin EC tablet 81 mg, 81 mg, Oral, Daily, Katrinka Blazing, Rondell A, MD, 81 mg at 08/01/23 1554   clopidogrel (PLAVIX) tablet 75 mg, 75 mg, Oral, Daily, Katrinka Blazing, Rondell A, MD, 75 mg at 08/01/23 1554   enoxaparin (LOVENOX) injection 40 mg, 40 mg, Subcutaneous, Q24H, Smith, Rondell A, MD, 40 mg at 08/01/23 1553   senna-docusate (Senokot-S) tablet 1 tablet, 1 tablet, Oral, QHS PRN, Clydie Braun, MD  Exam: Current vital signs: BP 125/73 (BP Location: Left Arm)   Pulse 69   Temp 98 F (36.7 C) (Oral)   Resp 19   Ht 6\' 2"  (1.88 m)   Wt 122 kg   SpO2 98%   BMI 34.54 kg/m  Vital signs in last 24 hours: Temp:  [98 F (36.7 C)-98.2 F (36.8 C)] 98 F (36.7 C) (08/31 1439) Pulse Rate:  [69-90] 69 (08/31 1439) Resp:  [10-19] 19 (08/31 1439) BP: (125-155)/(73-91) 125/73 (  08/31 1439) SpO2:  [98 %-100 %] 98 % (08/31 1439) Weight:  [213 kg] 122 kg (08/31 0851)  GENERAL: Awake, alert, in no acute distress Psych: Affect appropriate for situation, patient is calm and cooperative with examination Head: Normocephalic and atraumatic, without obvious abnormality EENT: Normal conjunctivae, dry mucous membranes, no OP obstruction LUNGS: Normal respiratory effort. Non-labored breathing on room air CV: Regular rate and rhythm on telemetry ABDOMEN: Soft, non-tender, non-distended Extremities: warm, well perfused, without obvious deformity  NEURO:  Mental Status: Awake,  alert, and oriented to person, place, time, and situation. He/She is able to provide a clear and coherent history of present illness. Speech/Language: speech is clear.  Naming, repetition, fluency, and comprehension intact without aphasia  No neglect is noted Cranial Nerves:  II: PERRL, visual fields full.  III, IV, VI: EOMI. Lid elevation symmetric and full.  V: Sensation is intact to light touch and symmetrical to face. Blinks to threat. Moves jaw back and forth.  VII: Face is symmetric resting and smiling. Able to puff cheeks and raise eyebrows.  VIII: Hearing intact to voice IX, X: Palate elevation is symmetric. Phonation normal.  XI: Normal sternocleidomastoid and trapezius muscle strength XII: Tongue protrudes midline without fasciculations.   Motor: 5/5 strength is all muscle groups.  Tone is normal. Bulk is normal.  Sensation: Intact to light touch bilaterally in all four extremities. No extinction to DSS present.  Coordination: FTN intact bilaterally. HKS intact bilaterally. No pronator drift. Alternating hand movements.  DTRs: 2+ throughout.  Gait: Deferred  NIHSS: TOTAL: 0   Labs I have reviewed labs in epic and the results pertinent to this consultation are: WBC: 2.8 Neut abs: 0.9 Cr: 1.25 AST: 55 ALT: 45 UDS: Negative UA: High SG, positive for ketones A1c: 5.9  CBC    Component Value Date/Time   WBC 2.8 (L) 08/01/2023 0918   RBC 5.05 08/01/2023 0918   HGB 14.4 08/01/2023 0918   HCT 43.8 08/01/2023 0918   PLT 199 08/01/2023 0918   MCV 86.7 08/01/2023 0918   MCH 28.5 08/01/2023 0918   MCHC 32.9 08/01/2023 0918   RDW 14.3 08/01/2023 0918   LYMPHSABS 1.6 08/01/2023 0918   MONOABS 0.2 08/01/2023 0918   EOSABS 0.2 08/01/2023 0918   BASOSABS 0.0 08/01/2023 0918    CMP     Component Value Date/Time   NA 134 (L) 08/01/2023 0918   NA 139 07/09/2021 1026   K 4.3 08/01/2023 0918   CL 103 08/01/2023 0918   CO2 25 08/01/2023 0918   GLUCOSE 80 08/01/2023  0918   BUN 20 08/01/2023 0918   BUN 11 07/09/2021 1026   CREATININE 1.25 (H) 08/01/2023 0918   CALCIUM 8.8 (L) 08/01/2023 0918   PROT 7.2 08/01/2023 0918   ALBUMIN 3.7 08/01/2023 0918   AST 55 (H) 08/01/2023 0918   ALT 45 (H) 08/01/2023 0918   ALKPHOS 56 08/01/2023 0918   BILITOT 0.9 08/01/2023 0918   GFRNONAA >60 08/01/2023 0918   GFRAA  10/10/2008 0925    >60        The eGFR has been calculated using the MDRD equation. This calculation has not been validated in all clinical    Lipid Panel  No results found for: "CHOL", "TRIG", "HDL", "CHOLHDL", "VLDL", "LDLCALC", "LDLDIRECT"   Imaging I have reviewed the images obtained:  CT-scan of the brain: Negative CT Angio head and neck: No LVO or significant stenosis.    Assessment: JOSS ROTHERMEL is a 52 y.o. male  with PMH significant for HLD, asthma, gout, GERD who presented to [facility] ED c/o left-sided weakness  Impression:As symptoms have resolved, this presentation leads to a diagnosis of TIA. Patient will need workup to explore his modifiable risk factors.   Recommendations: - Frequent Neuro checks per stroke unit protocol - MRI Brain stroke protocol - TTE - Lipid panel - Statin - will be started if LDL>70 or otherwise medically indicated - A1C - Antithrombotic - Aspirin and Plavix - DVT ppx - lovenox - Smoking cessation - will counsel patient - SBP goal - <220, PRN labetalol if HR>60 and PRN Hydralazine if HR<60 - Telemetry monitoring for arrhythmia - 72h - Swallow screen - will be performed prior to PO intake - Stroke education - will be given - PT/OT/SLP - Dispo: floor   Pt seen by Neuro NP/APP and later by MD. Note/plan to be edited by MD as needed.    Lynnae January, DNP, AGACNP-BC [facility] Please use AMION for contact information & EPIC for messaging.  NEUROHOSPITALIST ADDENDUM Performed a face to face diagnostic evaluation.   I have reviewed the contents of history and physical exam  as documented by PA/ARNP/Resident and agree with above documentation.  I have discussed and formulated the above plan as documented. Edits to the note have been made as needed.  Erick Blinks, MD [facility] [phone]   If 7pm to 7am, please call on call as listed on AMION.

## 2023-08-01 NOTE — Plan of Care (Deleted)
Transfer from [facility] Discussed with Dr. Dalene Seltzer. Lee Reynolds is a 52 year old male with pmh of HLD, asthma, gout, and GERD who presented with left-sided weakness.  Labs noted WBC 2.8, sodium 134, BUN 20, and creatinine 1.25.  Chest x-ray showed no acute abnormality.  Urinalysis positive for ketones with elevated specific gravity.  CT angiogram of the head and neck was obtained and read pending.  Symptoms had since resolved. Not a candidate for thrombolytics.   Case discussed with neurology who recommended admission for completion of tia/stroke workup.  Excepted to a medical telemetry bed as observation.

## 2023-08-01 NOTE — ED Provider Notes (Signed)
  [place] EMERGENCY DEPARTMENT AT [facility] Provider Note   CSN: [id number] Arrival date & time: 08/01/23  5784     History {Add pertinent medical, surgical, social history, OB history to HPI:1} Chief Complaint  Patient presents with   Extremity Weakness    Lee Reynolds is a 52 y.o. male.  HPI     Home Medications Prior to Admission medications   Medication Sig Start Date End Date Taking? Authorizing Provider  Albuterol Sulfate 108 (90 Base) MCG/ACT AEPB Inhale 1 puff into the lungs as needed.    [provider]  allopurinol (ZYLOPRIM) 300 MG tablet TAKE 1 TABLET BY MOUTH ONCE DAILY FOR 90 DAYS 07/15/18   [provider]  atorvastatin (LIPITOR) 80 MG tablet Take 80 mg by mouth daily.    [provider]  cyclobenzaprine (FLEXERIL) 10 MG tablet Take 1 tablet by mouth 3 (three) times daily as needed.    [provider]  esomeprazole (NEXIUM) 40 MG capsule Take 1 capsule (40 mg total) by mouth daily at 12 noon. 01/15/22   O'NealRonnald Ramp, MD  famotidine (PEPCID) 40 MG tablet Take 1 tablet by mouth at bedtime.    [provider]  fluticasone (FLONASE) 50 MCG/ACT nasal spray Place 1 spray into both nostrils daily.    [provider]  ibuprofen (ADVIL) 200 MG tablet Take 200 mg by mouth every 6 (six) hours as needed.    [provider]  indomethacin (INDOCIN) 25 MG capsule Take 1 capsule by mouth 2 (two) times daily.    [provider]  loratadine (CLARITIN) 10 MG tablet Take 10 mg by mouth daily.    [provider]  ZEPBOUND 2.5 MG/0.5ML Pen Inject 2.5 mg into the skin once a week. 05/27/23   [provider]      Allergies    Protonix [pantoprazole]    Review of Systems   Review of Systems  Physical Exam Updated Vital Signs BP (!) 155/91   Pulse 88   Temp 98.2 F (36.8 C) (Oral)   Resp 18   Ht 6\' 2"  (1.88 m)   Wt 122 kg   SpO2 99%   BMI 34.54 kg/m  Physical  Exam  ED Results / Procedures / Treatments   Labs (all labs ordered are listed, but only abnormal results are displayed) Labs Reviewed  CBG MONITORING, ED    EKG None  Radiology No results found.  Procedures Procedures  {Document cardiac monitor, telemetry assessment procedure when appropriate:1}  Medications Ordered in ED Medications - No data to display  ED Course/ Medical Decision Making/ A&P   {   Click here for ABCD2, HEART and other calculatorsREFRESH Note before signing :1}                              Medical Decision Making  ***  {Document critical care time when appropriate:1} {Document review of labs and clinical decision tools ie heart score, Chads2Vasc2 etc:1}  {Document your independent review of radiology images, and any outside records:1} {Document your discussion with family members, caretakers, and with consultants:1} {Document social determinants of health affecting pt's care:1} {Document your decision making why or why not admission, treatments were needed:1} Final Clinical Impression(s) / ED Diagnoses Final diagnoses:  None    Rx / DC Orders ED Discharge Orders     None

## 2023-08-01 NOTE — ED Notes (Signed)
Called [facility] for hospitalist for consult spoke with Festus Barren

## 2023-08-01 NOTE — H&P (Signed)
History and Physical    Patient: Lee Reynolds ZOX:[id number] DOB: 1971-07-19 DOA: 08/01/2023 DOS: the patient was seen and examined on 08/01/2023 PCP: [name], MD  Patient coming from: Transfer fro  Chief Complaint:  Chief Complaint  Patient presents with   Extremity Weakness   HPI: Lee Reynolds is a 52 y.o. male with medical history significant of HLD, asthma, gout, and GERD who presented with left-sided weakness.  Patient reports waking up in his normal state of health for which she was able to take a shower and around 7:30 AM noticed that the left side was weak.  He was still able to move his left upper and lower extremities, but they felt tingly.  Reported associated symptoms of pain underneath the left chest.  He checked his blood pressure and noted it to be elevated at 153/90.  Normally his blood pressures 120-130/70- 80s.  He does report taking a baby aspirin daily.  Any blood thinners.  Denies having any recent fever, shortness of breath, change in speech, change in vision, nausea, vomiting, diarrhea, or recent sick contacts to his knowledge.  Only recent changes that he had been dealing with some sinus drainage that he attributes to the weather changes and took Alka-Seltzer sinus last night.  For the last 3 months he has been on [facility] after being diagnosed with prediabetes for which she is lost 36 pounds.  He has also been regular exercising doing cardio 5 days a week and lifting weights 3 days a week.  He does not smoke any illicit drugs.  In the emergency department patient was noted to have blood pressures elevated up to 155/91, and all other vital signs relatively maintained.  Labs noted WBC 2.8, sodium 134, BUN 20, creatinine 1.25, and high-sensitivity troponin negative.  Chest x-ray showed no acute abnormality.  Urinalysis positive for ketones with elevated specific gravity.  CT angiogram of the head and neck was obtained large vessel occlusion.  Symptoms had since  resolved. He was not a candidate for thrombolytics   Review of Systems: As mentioned in the history of present illness. All other systems reviewed and are negative. Past Medical History:  Diagnosis Date   Asthma 10/11/2018   GERD (gastroesophageal reflux disease) 10/11/2018   Gout 10/11/2018   Headache    Hypercholesterolemia 10/11/2018   Hyperlipemia    Right hand pain 06/24/2018   Trigger finger of right thumb 06/24/2018   Past Surgical History:  Procedure Laterality Date   BACK SURGERY  10/2008   L4,L5 discectomy   Social History:  reports that he quit smoking about 3 years ago. His smoking use included cigars. He has never used smokeless tobacco. He reports that he does not drink alcohol and does not use drugs.  Allergies  Allergen Reactions   Protonix [Pantoprazole] Itching    Family History  Problem Relation Age of Onset   Hyperlipidemia Mother    Cancer Father    Heart disease Maternal Grandmother     Prior to Admission medications   Medication Sig Start Date End Date Taking? Authorizing Provider  Albuterol Sulfate 108 (90 Base) MCG/ACT AEPB Inhale 1 puff into the lungs as needed.    [provider]  allopurinol (ZYLOPRIM) 300 MG tablet TAKE 1 TABLET BY MOUTH ONCE DAILY FOR 90 DAYS 07/15/18   [provider]  atorvastatin (LIPITOR) 80 MG tablet Take 80 mg by mouth daily.    [provider]  cyclobenzaprine (FLEXERIL) 10 MG tablet Take 1 tablet  by mouth 3 (three) times daily as needed.    [provider]  esomeprazole (NEXIUM) 40 MG capsule Take 1 capsule (40 mg total) by mouth daily at 12 noon. 01/15/22   O'NealRonnald Ramp, MD  famotidine (PEPCID) 40 MG tablet Take 1 tablet by mouth at bedtime.    [provider]  fluticasone (FLONASE) 50 MCG/ACT nasal spray Place 1 spray into both nostrils daily.    [provider]  ibuprofen (ADVIL) 200 MG tablet Take 200 mg by mouth every 6 (six) hours as needed.    [provider]  indomethacin (INDOCIN) 25 MG capsule Take 1 capsule by mouth 2 (two) times daily.    [provider]  loratadine (CLARITIN) 10 MG tablet Take 10 mg by mouth daily.    [provider]  ZEPBOUND 2.5 MG/0.5ML Pen Inject 2.5 mg into the skin once a week. 05/27/23   [provider]    Physical Exam: Vitals:   08/01/23 0849 08/01/23 0851 08/01/23 0900 08/01/23 0930  BP: (!) 155/91 (!) 155/91 (!) 154/87   Pulse: 90 88 84   Resp:  18  10  Temp:  98.2 F (36.8 C)    TempSrc:  Oral    SpO2: 100% 99% 98%   Weight:  122 kg    Height:  6\' 2"  (1.88 m)      Constitutional: NAD, calm, comfortable Eyes: PERRL, lids and conjunctivae normal ENMT: Mucous membranes are moist. Posterior pharynx clear of any exudate or lesions.Normal dentition.  Neck: normal, supple, no masses, no thyromegaly Respiratory: clear to auscultation bilaterally, no wheezing, no crackles. Normal respiratory effort. No accessory muscle use.  Cardiovascular: Regular rate and rhythm, no murmurs / rubs / gallops. No extremity edema. 2+ pedal pulses. No carotid bruits.  Abdomen: no tenderness, no masses palpated. No hepatosplenomegaly. Bowel sounds positive.  Musculoskeletal: no clubbing / cyanosis. No joint deformity upper and lower extremities. Good ROM, no contractures. Normal muscle tone.  Skin: no rashes, lesions, ulcers. No induration Neurologic: CN 2-12 grossly intact. Sensation intact, DTR normal. Strength 5/5 in all 4.  Psychiatric: Normal judgment and insight. Alert and oriented x 3. Normal mood.   Data Reviewed:  EKG revealed normal sinus rhythm at 83 bpm without acute abnormality.  Reviewed labs, imaging, and pertinent records as noted above in HPI.  Assessment and Plan:  Suspected TIA Patient presents with complaints of left-sided weakness starting this morning at around 7:30 AM.  Symptoms lasted for couple hours and subsequently resolved.  CT angiogram of the head and neck  negative for any acute abnormality.  Patient was not a candidate for thrombolytics as symptoms have not resolved.  Patient normally on daily aspirin. -Admit to a cardiac telemetry bed -Stroke order set utilized -Neurochecks -lipid panel and Hemoglobin A1c -Check echocardiogram -Check MRI of the brain -ASA, Plavix, and statin -Follow-up telemetry overnight -Neurology consulted, will follow-up for any further recommendations  Leukopenia Acute.  WBC 2.8.  Patient otherwise noted to be afebrile and no other SIRS criteria met.  He makes note of having sinus congestion. -Recheck CBC tomorrow morning  Elevated blood pressure Patient reports having blood pressures elevated up to 150s/90s this morning.  Normally patient is not on any blood pressure medications at baseline.  Most recent blood pressure of 125/73. -Continue to monitor  Hyperlipidemia -Follow-up lipid panel -Continue atorvastatin  Chronic kidney disease stage II Creatinine 1.25 with BUN 20.  Baseline creatinine appears similar.  Patient's urinalysis was noted to  have elevated specific gravity suggests possible dehydration.  Patient has been given 1 L normal saline IV fluids.  Transaminitis Acute.  AST 50 and ALT 45.  No priors to compare. -Check CMP in a.m.  Prediabetes Patient reports being prediabetic. -Follow-up hemoglobin A1c (5.9)  History of gout -Continue allopurinol  Obesity Patient on Mounjaro for the last 3 months with reports of losing 36 pounds while also  exercising. BMI 34.54 kg/m  DVT prophylaxis: Lovenox Advance Care Planning:   Code Status: Full Code    Consults: Neurology  Family Communication: Wife updated over the phone  Severity of Illness: The appropriate patient status for this patient is OBSERVATION. Observation status is judged to be reasonable and necessary in order to provide the required intensity of service to ensure the patient's safety. The patient's presenting symptoms, physical  exam findings, and initial radiographic and laboratory data in the context of their medical condition is felt to place them at decreased risk for further clinical deterioration. Furthermore, it is anticipated that the patient will be medically stable for discharge from the hospital within 2 midnights of admission.   Author: Clydie Braun, MD 08/01/2023 1:38 PM  For on call review [email or web address].

## 2023-08-01 NOTE — ED Notes (Signed)
Updated patient on labs, plan of care, answered all questions.wife instructed to call if pts symptoms change.

## 2023-08-02 ENCOUNTER — Observation Stay (HOSPITAL_COMMUNITY): Payer: No Typology Code available for payment source

## 2023-08-02 DIAGNOSIS — I361 Nonrheumatic tricuspid (valve) insufficiency: Secondary | ICD-10-CM

## 2023-08-02 DIAGNOSIS — G459 Transient cerebral ischemic attack, unspecified: Secondary | ICD-10-CM | POA: Diagnosis not present

## 2023-08-02 LAB — COMPREHENSIVE METABOLIC PANEL
ALT: 50 U/L — ABNORMAL HIGH (ref 0–44)
AST: 55 U/L — ABNORMAL HIGH (ref 15–41)
Albumin: 3.2 g/dL — ABNORMAL LOW (ref 3.5–5.0)
Alkaline Phosphatase: 54 U/L (ref 38–126)
Anion gap: 9 (ref 5–15)
BUN: 15 mg/dL (ref 6–20)
CO2: 23 mmol/L (ref 22–32)
Calcium: 8.8 mg/dL — ABNORMAL LOW (ref 8.9–10.3)
Chloride: 105 mmol/L (ref 98–111)
Creatinine, Ser: 1.26 mg/dL — ABNORMAL HIGH (ref 0.61–1.24)
GFR, Estimated: >60 mL/min (ref >60)
Glucose, Bld: 84 mg/dL (ref 70–99)
Potassium: 3.8 mmol/L (ref 3.5–5.1)
Sodium: 137 mmol/L (ref 135–145)
Total Bilirubin: 0.8 mg/dL (ref 0.3–1.2)
Total Protein: 6.4 g/dL — ABNORMAL LOW (ref 6.5–8.1)

## 2023-08-02 LAB — ECHOCARDIOGRAM COMPLETE
AR max vel: 3.23 cm2
AV Peak grad: 7.7 mmHg
Ao pk vel: 1.39 m/s
Area-P 1/2: 3.77 cm2
Height: 74 in
MV M vel: 4.03 m/s
MV Peak grad: 65 mmHg
S' Lateral: 2.55 cm
Weight: 4304 oz

## 2023-08-02 LAB — LIPID PANEL
Cholesterol: 117 mg/dL (ref 0–200)
HDL: 29 mg/dL — ABNORMAL LOW (ref >40)
LDL Cholesterol: 76 mg/dL (ref 0–99)
Total CHOL/HDL Ratio: 4 ratio
Triglycerides: 59 mg/dL (ref <150)
VLDL: 12 mg/dL (ref 0–40)

## 2023-08-02 MED ORDER — ATORVASTATIN CALCIUM 40 MG PO TABS: 40.0000 mg | ORAL_TABLET | Freq: Every day | ORAL | Status: DC

## 2023-08-02 MED ORDER — ATORVASTATIN CALCIUM 80 MG PO TABS
80.0000 mg | ORAL_TABLET | Freq: Every day | ORAL | Status: DC
  Administered 2023-08-02: 80 mg via ORAL

## 2023-08-02 MED ORDER — CLOPIDOGREL BISULFATE 75 MG PO TABS: 75.0000 mg | ORAL_TABLET | Freq: Every day | ORAL | 1 refills | Status: DC

## 2023-08-02 NOTE — Progress Notes (Addendum)
STROKE TEAM PROGRESS NOTE   SUBJECTIVE (INTERVAL HISTORY) His wife is at the bedside.  Overall his condition is completely resolved. He stated that yesterday after he had shower, he felt left arm and leg weakness and tingling, lasted about 1 hour and resolved. His wife was with him at that time but she said she did not notice any abnormalities, no facial droop or drooling, no speech deficit, still able to ambulate as usual.    OBJECTIVE Temp:  [98 F (36.7 C)-98.3 F (36.8 C)] 98.3 F (36.8 C) (09/01 0700) Pulse Rate:  [68-77] 75 (09/01 0752) Cardiac Rhythm: Normal sinus rhythm (09/01 0826) Resp:  [10-19] 14 (09/01 0752) BP: (108-130)/(51-86) 117/86 (09/01 0752) SpO2:  [95 %-98 %] 98 % (09/01 0752)  No results for input(s): "GLUCAP" in the last 168 hours. Recent Labs  Lab 08/01/23 0918 08/02/23 0239  NA 134* 137  K 4.3 3.8  CL 103 105  CO2 25 23  GLUCOSE 80 84  BUN 20 15  CREATININE 1.25* 1.26*  CALCIUM 8.8* 8.8*   Recent Labs  Lab 08/01/23 0918 08/02/23 0239  AST 55* 55*  ALT 45* 50*  ALKPHOS 56 54  BILITOT 0.9 0.8  PROT 7.2 6.4*  ALBUMIN 3.7 3.2*   Recent Labs  Lab 08/01/23 0918 08/01/23 1610  WBC 2.8* 4.1  NEUTROABS 0.9* 1.8  HGB 14.4 13.6  HCT 43.8 41.6  MCV 86.7 85.4  PLT 199 189   No results for input(s): "CKTOTAL", "CKMB", "CKMBINDEX", "TROPONINI" in the last 168 hours. No results for input(s): "LABPROT", "INR" in the last 72 hours. Recent Labs    08/01/23 1105  COLORURINE YELLOW  LABSPEC >1.046*  PHURINE 5.5  GLUCOSEU NEGATIVE  HGBUR NEGATIVE  BILIRUBINUR NEGATIVE  KETONESUR 15*  PROTEINUR TRACE*  NITRITE NEGATIVE  LEUKOCYTESUR NEGATIVE       Component Value Date/Time   CHOL 117 08/02/2023 0239   TRIG 59 08/02/2023 0239   HDL 29 (L) 08/02/2023 0239   CHOLHDL 4.0 08/02/2023 0239   VLDL 12 08/02/2023 0239   LDLCALC 76 08/02/2023 0239   Lab Results  Component Value Date   HGBA1C 5.9 (H) 08/01/2023      Component Value Date/Time    LABOPIA NONE DETECTED 08/01/2023 1105   COCAINSCRNUR NONE DETECTED 08/01/2023 1105   LABBENZ NONE DETECTED 08/01/2023 1105   AMPHETMU NONE DETECTED 08/01/2023 1105   THCU NONE DETECTED 08/01/2023 1105   LABBARB NONE DETECTED 08/01/2023 1105    No results for input(s): "ETH" in the last 168 hours.  I have personally reviewed the radiological images below and agree with the radiology interpretations.  MR BRAIN WO CONTRAST  Result Date: 08/02/2023 CLINICAL DATA:  Follow-up examination for stroke. EXAM: MRI HEAD WITHOUT CONTRAST TECHNIQUE: Multiplanar, multiecho pulse sequences of the brain and surrounding structures were obtained without intravenous contrast. COMPARISON:  Prior study from earlier the same day. FINDINGS: Brain: Cerebral volume within normal limits for age. Two adjacent subcentimeter foci of FLAIR hyperintensity noted involving the subcortical left frontal lobe (series 7, image 31), nonspecific, but of doubtful significance in the acute setting. No other significant cerebral white matter disease or other focal parenchymal signal abnormality. No evidence for acute or subacute ischemia. Gray-white matter differentiation maintained. No areas of chronic cortical infarction. No acute or chronic intracranial blood products. No mass lesion, midline shift or mass effect. No hydrocephalus or extra-axial fluid collection. Pituitary gland and suprasellar region within normal limits. Vascular: Major intracranial vascular flow voids are maintained. Skull  and upper cervical spine: Craniocervical junction within normal limits. Bone marrow signal intensity normal. No scalp soft tissue abnormality. Sinuses/Orbits: Globes and orbital soft tissues within normal limits. Mild mucosal thickening about the hypoplastic right sphenoid sinus. Paranasal sinuses are otherwise clear. No mastoid effusion. Other: Heterogeneous appearance of the parotid glands noted bilaterally. IMPRESSION: Normal brain MRI for age. No  acute intracranial infarct or other abnormality. Electronically Signed   By: Rise Mu M.D.   On: 08/02/2023 02:30   CT ANGIO HEAD NECK W WO CM  Result Date: 08/01/2023 CLINICAL DATA:  52 year old male with left extremity weakness since 0730 hours. Hypertensive. EXAM: CT ANGIOGRAPHY HEAD AND NECK WITH AND WITHOUT CONTRAST TECHNIQUE: Multidetector CT imaging of the head and neck was performed using the standard protocol during bolus administration of intravenous contrast. Multiplanar CT image reconstructions and MIPs were obtained to evaluate the vascular anatomy. Carotid stenosis measurements (when applicable) are obtained utilizing NASCET criteria, using the distal internal carotid diameter as the denominator. RADIATION DOSE REDUCTION: This exam was performed according to the departmental dose-optimization program which includes automated exposure control, adjustment of the mA and/or kV according to patient size and/or use of iterative reconstruction technique. CONTRAST:  75mL OMNIPAQUE IOHEXOL 350 MG/ML SOLN COMPARISON:  Outside brain MRI 04/24/2020 FINDINGS: CT HEAD Brain: Cerebral volume is within normal limits for age. No midline shift, ventriculomegaly, mass effect, evidence of mass lesion, intracranial hemorrhage or evidence of cortically based acute infarction. Gray-white differentiation within normal limits for age. Faint basal ganglia vascular calcifications on the left. No suspicious intracranial vascular hyperdensity. Calvarium and skull base: Negative. Paranasal sinuses: Visualized paranasal sinuses and mastoids are clear. Orbits: No gaze deviation. Partially visible asymmetric atrophy of the right parotid gland. Bilateral parotid sialolithiasis. Visualized scalp soft tissues are within normal limits. CTA NECK Skeleton: Impacted right mandible anterior alveolus tooth or supernumerary tooth. Cervical spine degeneration. No acute osseous abnormality identified. Upper chest: Negative. Other  neck: Nodular atrophy of both submandibular glands, right parotid gland. Punctate parotid sialolithiasis. No acute finding. Aortic arch: 3 vessel arch.  No arch atherosclerosis. Right carotid system: Negative through the distal right CCA. Mild calcified plaque at the right ICA origin and bulb. No stenosis. Left carotid system: Negative.  No significant plaque or stenosis. Vertebral arteries: Proximal right subclavian artery and right vertebral artery origin are normal. Somewhat diminutive although also dominant right vertebral artery is patent to the skull base with no plaque or stenosis. Negative proximal left subclavian artery. Highly diminutive left vertebral artery, but normal origin and the small vessel remains patent to the skull base with no plaque or stenosis identified. CTA HEAD Posterior circulation: Diminutive distal left vertebral artery terminates in PICA. Distal right vertebral artery supplies the basilar. Right PICA origin is patent. Patent although diminutive basilar artery with fetal type bilateral PCA origins. No basilar stenosis. Patent SCA Z and small bilateral P1 Speer it bilateral PCA branches are within normal limits. Anterior circulation: Both ICA siphons are patent. Mild left greater than right cavernous segment calcified plaque. No siphon stenosis. Normal posterior communicating artery origins. Patent carotid termini. Normal ophthalmic artery origins. Normal MCA and ACA origins. Diminutive anterior communicating artery. Bilateral ACA branches are within normal limits. Left MCA M1 segment and bifurcation are patent without stenosis. Right MCA M1 segment and bifurcation are patent without stenosis. Bilateral MCA branches are within normal limits. Venous sinuses: Patent. Anatomic variants: Diminutive vertebrobasilar system on the basis of fetal type PCA origins. Dominant right and highly diminutive left  vertebral, the left vertebral terminates in PICA. Review of the MIP images confirms the  above findings IMPRESSION: 1. CTA is negative for large vessel occlusion. Mild for age atherosclerosis in the head and neck with no arterial stenosis. 2. Normal for age CT appearance of the brain. 3. 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 Electronically Signed   By: Odessa Fleming M.D.   On: 08/01/2023 10:57   DG Chest Portable 1 View  Result Date: 08/01/2023 CLINICAL DATA:  Chest pain. EXAM: PORTABLE CHEST 1 VIEW COMPARISON:  Chest radiographs 06/11/2021 and 10/01/2018 FINDINGS: Cardiac silhouette and mediastinal contours within normal limits. Unchanged chronic left basilar curvilinear mild scar. No acute airspace opacity. No pleural effusion pneumothorax. No acute skeletal abnormality. IMPRESSION: No acute cardiopulmonary process. Electronically Signed   By: Neita Garnet M.D.   On: 08/01/2023 10:15     PHYSICAL EXAM  Temp:  [98 F (36.7 C)-98.3 F (36.8 C)] 98.3 F (36.8 C) (09/01 0700) Pulse Rate:  [68-77] 75 (09/01 0752) Resp:  [10-19] 14 (09/01 0752) BP: (108-130)/(51-86) 117/86 (09/01 0752) SpO2:  [95 %-98 %] 98 % (09/01 0752)  General - Well nourished, well developed, in no apparent distress.  Ophthalmologic - fundi not visualized due to noncooperation.  Cardiovascular - Regular rhythm and rate.  Mental Status -  Level of arousal and orientation to time, place, and person were intact. Language including expression, naming, repetition, comprehension was assessed and found intact. Attention span and concentration were normal. Fund of Knowledge was assessed and was intact.  Cranial Nerves II - XII - II - Visual field intact OU. III, IV, VI - Extraocular movements intact. V - Facial sensation intact bilaterally. VII - Facial movement intact bilaterally. VIII - Hearing & vestibular intact bilaterally. X - Palate elevates symmetrically. XI - Chin turning & shoulder shrug intact  bilaterally. XII - Tongue protrusion intact.  Motor Strength - The patient's strength was normal in all extremities and pronator drift was absent.  Bulk was normal and fasciculations were absent.   Motor Tone - Muscle tone was assessed at the neck and appendages and was normal.  Reflexes - The patient's reflexes were symmetrical in all extremities and he had no pathological reflexes.  Sensory - Light touch, temperature/pinprick were assessed and were symmetrical.    Coordination - The patient had normal movements in the hands and feet with no ataxia or dysmetria.  Tremor was absent.  Gait and Station - deferred.   ASSESSMENT/PLAN Lee Reynolds is a 52 y.o. male with history of hyperlipidemia, OSA, gout, obesity on Mounjaro, lower back pain admitted for IV of left-sided weakness and tingling for 1 hour. No tPA given due to symptom resolved.    Possible right brain TIA Subjective left-sided weakness and tingling lasting 1 hour.  Denies headache CT no acute abnormality CT head and neck unremarkable MRI no acute infarct 2D Echo EF 60-65% LDL 76 HgbA1c 5.9 UDS negative Lovenox for VTE prophylaxis aspirin 81 mg daily prior to admission, now on aspirin 81 mg daily and clopidogrel 75 mg daily DAPT for 3 weeks and then Plavix alone. Patient counseled to be compliant with his antithrombotic medications Ongoing aggressive stroke risk factor management Therapy recommendations: None Disposition: Home  BP measurement Stable BP monitoring at home Long term BP goal normotensive  Hyperlipidemia Mildly elevated LFTs Home meds: Lipitor 80 LDL 76, goal < 70 AST/ALT 47/82--95/62 Now on Lipitor 80 Continue statin  at discharge Follow-up with PCP closely to monitor LFTs  Other Stroke Risk Factors Former cigar user Obesity, Body mass index is 34.54 kg/m.  On Mounjaro recently, lost 36 pounds OSA followed by Dr. Frances Furbish  Other Active Problems Lower back pain Gout   Hospital day #  0  Neurology will sign off. Please call with questions. Pt will follow up with Dr. Marjory Lies at [facility] in about 4 weeks. Thanks for the consult.   Marvel Plan, MD PhD Stroke Neurology 08/02/2023 10:41 AM    To contact Stroke Continuity provider, please refer to [email or web address]. After hours, contact General Neurology

## 2023-08-02 NOTE — ED Provider Notes (Incomplete)
[place] EMERGENCY DEPARTMENT AT [facility] Provider Note   CSN: [id number] Arrival date & time: 08/01/23  7829     History {Add pertinent medical, surgical, social history, OB history to HPI:1} Chief Complaint  Patient presents with  . Extremity Weakness    Lee Reynolds is a 52 y.o. male.  HPI      52 year old male with a history of hypertension, hyperlipidemia, asthma, who presents with concern for left sided weakness.   LNW 730AM. Took a shower and then suddenly noticed that his left side was weak, they felt numbn, weak, "not right." Like he couldn't move his left hand normally. Feels better now but not completely back to self.  Denies difficulty talking, visual changes or facial droop. He was able to walk but felt off because left side felt different   Past Medical History:  Diagnosis Date  . Asthma 10/11/2018  . GERD (gastroesophageal reflux disease) 10/11/2018  . Gout 10/11/2018  . Headache   . Hypercholesterolemia 10/11/2018  . Hyperlipemia   . Right hand pain 06/24/2018  . Trigger finger of right thumb 06/24/2018     Home Medications Prior to Admission medications   Medication Sig Start Date End Date Taking? Authorizing Provider  Albuterol Sulfate 108 (90 Base) MCG/ACT AEPB Inhale 1 puff into the lungs as needed.    [provider]  allopurinol (ZYLOPRIM) 300 MG tablet TAKE 1 TABLET BY MOUTH ONCE DAILY FOR 90 DAYS 07/15/18   [provider]  atorvastatin (LIPITOR) 80 MG tablet Take 80 mg by mouth daily.    [provider]  cyclobenzaprine (FLEXERIL) 10 MG tablet Take 1 tablet by mouth 3 (three) times daily as needed.    [provider]  esomeprazole (NEXIUM) 40 MG capsule Take 1 capsule (40 mg total) by mouth daily at 12 noon. 01/15/22   O'NealRonnald Ramp, MD  famotidine (PEPCID) 40 MG tablet Take 1 tablet by mouth at bedtime.    [provider]  fluticasone (FLONASE) 50 MCG/ACT nasal spray Place 1 spray  into both nostrils daily.    [provider]  ibuprofen (ADVIL) 200 MG tablet Take 200 mg by mouth every 6 (six) hours as needed.    [provider]  indomethacin (INDOCIN) 25 MG capsule Take 1 capsule by mouth 2 (two) times daily.    [provider]  loratadine (CLARITIN) 10 MG tablet Take 10 mg by mouth daily.    [provider]  ZEPBOUND 2.5 MG/0.5ML Pen Inject 2.5 mg into the skin once a week. 05/27/23   [provider]      Allergies    Protonix [pantoprazole]    Review of Systems   Review of Systems  Physical Exam Updated Vital Signs BP (!) 155/91   Pulse 88   Temp 98.2 F (36.8 C) (Oral)   Resp 18   Ht 6\' 2"  (1.88 m)   Wt 122 kg   SpO2 99%   BMI 34.54 kg/m  Physical Exam  ED Results / Procedures / Treatments   Labs (all labs ordered are listed, but only abnormal results are displayed) Labs Reviewed  CBG MONITORING, ED    EKG None  Radiology No results found.  Procedures Procedures  {Document cardiac monitor, telemetry assessment procedure when appropriate:1}  Medications Ordered in ED Medications - No data to display  ED Course/ Medical Decision Making/ A&P   {   Click here for ABCD2, HEART and other calculatorsREFRESH Note before signing :1}  52 year old male with a history of hypertension, hyperlipidemia, asthma, who presents with concern for left sided weakness.   {Document critical care time when appropriate:1} {Document review of labs and clinical decision tools ie heart score, Chads2Vasc2 etc:1}  {Document your independent review of radiology images, and any outside records:1} {Document your discussion with family members, caretakers, and with consultants:1} {Document social determinants of health affecting pt's care:1} {Document your decision making why or why not admission, treatments were needed:1} Final Clinical Impression(s) / ED Diagnoses Final diagnoses:  None     Rx / DC Orders ED Discharge Orders     None

## 2023-08-02 NOTE — Progress Notes (Signed)
Echocardiogram 2D Echocardiogram has been performed.  Lucendia Herrlich 08/02/2023, 12:47 PM

## 2023-08-02 NOTE — Discharge Summary (Signed)
PATIENT DETAILS Name: Lee Reynolds Age: 52 y.o. Sex: male Date of Birth: 03-12-1971 MRN: [id number]. Admitting Physician: Lee Braun, MD Lee Reynolds:[id number], Lee Score, MD  Admit Date: 08/01/2023 Discharge date: 08/02/2023  Recommendations for Outpatient Follow-up:  Follow up with PCP in 1-2 weeks Please obtain CMP/CBC in one week LFTs minimally elevated-please reassess in 2 weeks.  Admitted From:  Home  Disposition: Home   Discharge Condition: good  CODE STATUS:   Code Status: Full Code   Diet recommendation:  Diet Order             Diet - low sodium heart healthy           Diet Heart Room service appropriate? Yes; Fluid consistency: Thin  Diet effective now                    Brief Summary: 52 year old with history of HLD-presented with transient left-sided weakness.  Admitted to [facility] for further workup and treatment  Significant studies 8/31>> CT angio head/neck: No LVO-no significant stenosis 8/31>> A1c: 5.9 9/01>> MRI brain: No CVA 9/01>> LDL: 76 9/01>> TTE:  Brief Hospital Course: TIA Transient left-sided weakness has resolved Workup as above Neurology followed closely Aspirin/Plavix x 3 weeks followed by Plavix alone Continue high intensity statin on discharge  Mild transaminitis Continue cautiously with statin PCP to consider repeating LFTs in 2 weeks time  Rest of his medical problems were stable during the short overnight hospitalization  Obesity: Estimated body mass index is 34.54 kg/m as calculated from the following:   Height as of this encounter: 6\' 2"  (1.88 m).   Weight as of this encounter: 122 kg.   Discharge Diagnoses:  Principal Problem:   TIA (transient ischemic attack) Active Problems:   Leukopenia   Elevated blood pressure reading   Hypercholesterolemia   CKD (chronic kidney disease) stage 2, GFR 60-89 ml/min   Transaminitis   Prediabetes   Gout   Obesity (BMI 30-39.9)   Discharge Instructions:  Activity:   As tolerated   Discharge Instructions     Ambulatory referral to Neurology   Complete by: As directed    An appointment is requested in approximately: 8 weeks   Ambulatory referral to Neurology   Complete by: As directed    Follow up with Lee Reynolds at [facility] in 4-6 weeks.  Patient is Dr. Richrd Reynolds patient. Thanks.   Call MD for:  extreme fatigue   Complete by: As directed    Call MD for:  persistant dizziness or light-headedness   Complete by: As directed    Diet - low sodium heart healthy   Complete by: As directed    Discharge instructions   Complete by: As directed    Follow with Primary MD  Lee Hale, MD in 1-2 weeks  Follow-up with the stroke clinic-they will call you to follow-up appointment-if you do not hear from them-please give them a call  Take aspirin along with Plavix x 3 weeks, after 3 weeks stop aspirin and continue on Plavix  Your liver function enzymes were minimally elevated-please ask your primary care practitioner to repeat liver function enzymes in 2 weeks.  Please get a complete blood count and chemistry panel checked by your Primary MD at your next visit, and again as instructed by your Primary MD.  Get Medicines reviewed and adjusted: Please take all your medications with you for your next visit with your Primary MD  Laboratory/radiological data: Please request your Primary MD to  go over all hospital tests and procedure/radiological results at the follow up, please ask your Primary MD to get all Hospital records sent to his/her office.  In some cases, they will be blood work, cultures and biopsy results pending at the time of your discharge. Please request that your primary care M.D. follows up on these results.  Also Note the following: If you experience worsening of your admission symptoms, develop shortness of breath, life threatening emergency, suicidal or homicidal thoughts you must seek medical attention immediately by calling 911 or  calling your MD immediately  if symptoms less severe.  You must read complete instructions/literature along with all the possible adverse reactions/side effects for all the Medicines you take and that have been prescribed to you. Take any new Medicines after you have completely understood and accpet all the possible adverse reactions/side effects.   Do not drive when taking Pain medications or sleeping medications (Benzodaizepines)  Do not take more than prescribed Pain, Sleep and Anxiety Medications. It is not advisable to combine anxiety,sleep and pain medications without talking with your primary care practitioner  Special Instructions: If you have smoked or chewed Tobacco  in the last 2 yrs please stop smoking, stop any regular Alcohol  and or any Recreational drug use.  Wear Seat belts while driving.  Please note: You were cared for by a hospitalist during your hospital stay. Once you are discharged, your primary care physician will handle any further medical issues. Please note that NO REFILLS for any discharge medications will be authorized once you are discharged, as it is imperative that you return to your primary care physician (or establish a relationship with a primary care physician if you do not have one) for your post hospital discharge needs so that they can reassess your need for medications and monitor your lab values.   Increase activity slowly   Complete by: As directed       Allergies as of 08/02/2023       Reactions   Protonix [pantoprazole] Itching        Medication List     STOP taking these medications    esomeprazole 40 MG capsule Commonly known as: NEXIUM       TAKE these medications    Albuterol Sulfate 108 (90 Base) MCG/ACT Aepb Commonly known as: PROAIR RESPICLICK Inhale 1 puff into the lungs as needed.   allopurinol 300 MG tablet Commonly known as: ZYLOPRIM TAKE 1 TABLET BY MOUTH ONCE DAILY FOR 90 DAYS   aspirin EC 81 MG tablet Take 81 mg  by mouth daily. Swallow whole.   atorvastatin 80 MG tablet Commonly known as: LIPITOR Take 80 mg by mouth daily.   clopidogrel 75 MG tablet Commonly known as: Plavix Take 1 tablet (75 mg total) by mouth daily.   cyclobenzaprine 10 MG tablet Commonly known as: FLEXERIL Take 1 tablet by mouth 3 (three) times daily as needed.   famotidine 40 MG tablet Commonly known as: PEPCID Take 1 tablet by mouth at bedtime. PRN   fluticasone 50 MCG/ACT nasal spray Commonly known as: FLONASE Place 1 spray into both nostrils daily. PRN   ibuprofen 200 MG tablet Commonly known as: ADVIL Take 200 mg by mouth every 6 (six) hours as needed.   indomethacin 25 MG capsule Commonly known as: INDOCIN Take 1 capsule by mouth 2 (two) times daily. PRN   loratadine 10 MG tablet Commonly known as: CLARITIN Take 10 mg by mouth daily.   Zepbound 2.5 MG/0.5ML Pen  Generic drug: tirzepatide Inject 2.5 mg into the skin once a week.        Follow-up Information     Lee Hale, MD. Schedule an appointment as soon as possible for a visit in 1 week(s).   Specialty: Family Medicine Contact information: [address] [place] [address] [phone]         Suanne Marker, MD. Schedule an appointment as soon as possible for a visit in 1 month(s).   Specialties: Neurology, Radiology Contact information: [address] [place] [address] [phone]                Allergies  Allergen Reactions   Protonix [Pantoprazole] Itching     Other Procedures/Studies: ECHOCARDIOGRAM COMPLETE  Result Date: 08/02/2023    ECHOCARDIOGRAM REPORT   Patient Name:   Lee Reynolds Date of Exam: 08/02/2023 Medical Rec #:  [id number]      Height:       74.0 in Accession #:    [id number]     Weight:       269.0 lb Date of Birth:  November 14, 1971       BSA:          2.465 m Patient Age:    52 years       BP:           108/69 mmHg Patient Gender: M              HR:           65  bpm. Exam Location:  Inpatient Procedure: 2D Echo, Cardiac Doppler and Color Doppler Indications:    TIA G45.9  History:        Patient has prior history of Echocardiogram examinations, most                 recent 10/18/2018. TIA, Signs/Symptoms:Chest Pain; Risk                 Factors:Dyslipidemia. CKD, stage 2.  Sonographer:    Lucendia Herrlich Referring Phys: [id number] Dewayne Shorter M Yvon Mccord IMPRESSIONS  1. Left ventricular ejection fraction, by estimation, is 60 to 65%. The left ventricle has normal function. The left ventricle has no regional wall motion abnormalities. There is mild left ventricular hypertrophy. Left ventricular diastolic parameters were normal.  2. Right ventricular systolic function is normal. The right ventricular size is normal.  3. The mitral valve is grossly normal. No evidence of mitral valve regurgitation.  4. The aortic valve was not well visualized. Aortic valve regurgitation is not visualized. No aortic stenosis is present.  5. The inferior vena cava not well visualized. Conclusion(s)/Recommendation(s): Interatrial septum not well visualized, does not appear to have significant PFO. No other cardioembolic causes of stroke were identified. Can consider TEE with high suspicion for cardioembolic source. FINDINGS  Left Ventricle: Left ventricular ejection fraction, by estimation, is 60 to 65%. The left ventricle has normal function. The left ventricle has no regional wall motion abnormalities. The left ventricular internal cavity size was normal in size. There is  mild left ventricular hypertrophy. Left ventricular diastolic parameters were normal. Right Ventricle: The right ventricular size is normal. Right ventricular systolic function is normal. Left Atrium: Left atrial size was normal in size. Right Atrium: Right atrial size was normal in size. Pericardium: There is no evidence of pericardial effusion. Mitral Valve: The mitral valve is grossly normal. No evidence of mitral valve regurgitation.  Tricuspid Valve: Tricuspid valve regurgitation is mild.  Aortic Valve: The aortic valve was not well visualized. Aortic valve regurgitation is not visualized. No aortic stenosis is present. Aortic valve peak gradient measures 7.7 mmHg. Pulmonic Valve: Pulmonic valve regurgitation is not visualized. Aorta: The aortic root and ascending aorta are structurally normal, with no evidence of dilitation. Venous: The inferior vena cava not well visualized. IAS/Shunts: The interatrial septum was not well visualized.  LEFT VENTRICLE PLAX 2D LVIDd:         4.05 cm   Diastology LVIDs:         2.55 cm   LV e' medial:    10.70 cm/s LV PW:         1.05 cm   LV E/e' medial:  9.0 LV IVS:        1.00 cm   LV e' lateral:   16.80 cm/s LVOT diam:     2.20 cm   LV E/e' lateral: 5.7 LV SV:         86 LV SV Index:   35 LVOT Area:     3.80 cm  RIGHT VENTRICLE             IVC RV S prime:     12.90 cm/s  IVC diam: 2.40 cm TAPSE (M-mode): 1.8 cm LEFT ATRIUM             Index        RIGHT ATRIUM           Index LA diam:        3.40 cm 1.38 cm/m   RA Area:     17.20 cm LA Vol (A2C):   47.6 ml 19.31 ml/m  RA Volume:   45.40 ml  18.42 ml/m LA Vol (A4C):   45.7 ml 18.54 ml/m LA Biplane Vol: 47.3 ml 19.19 ml/m  AORTIC VALVE AV Area (Vmax): 3.23 cm AV Vmax:        139.00 cm/s AV Peak Grad:   7.7 mmHg LVOT Vmax:      118.00 cm/s LVOT Vmean:     75.800 cm/s LVOT VTI:       0.226 m  AORTA Ao Root diam: 3.30 cm Ao Asc diam:  3.40 cm MITRAL VALVE               TRICUSPID VALVE MV Area (PHT): 3.77 cm    TR Peak grad:   19.9 mmHg MV Decel Time: 201 msec    TR Vmax:        223.00 cm/s MR Peak grad: 65.0 mmHg MR Vmax:      403.00 cm/s  SHUNTS MV E velocity: 95.90 cm/s  Systemic VTI:  0.23 m MV A velocity: 83.40 cm/s  Systemic Diam: 2.20 cm MV E/A ratio:  1.15 [place] signed by Carolan Clines Signature Date/Time: 08/02/2023/1:09:11 PM    Final    MR BRAIN WO CONTRAST  Result Date: 08/02/2023 CLINICAL DATA:  Follow-up examination for  stroke. EXAM: MRI HEAD WITHOUT CONTRAST TECHNIQUE: Multiplanar, multiecho pulse sequences of the brain and surrounding structures were obtained without intravenous contrast. COMPARISON:  Prior study from earlier the same day. FINDINGS: Brain: Cerebral volume within normal limits for age. Two adjacent subcentimeter foci of FLAIR hyperintensity noted involving the subcortical left frontal lobe (series 7, image 31), nonspecific, but of doubtful significance in the acute setting. No other significant cerebral white matter disease or other focal parenchymal signal abnormality. No evidence for acute or subacute ischemia. Gray-white matter differentiation maintained. No areas of chronic cortical  infarction. No acute or chronic intracranial blood products. No mass lesion, midline shift or mass effect. No hydrocephalus or extra-axial fluid collection. Pituitary gland and suprasellar region within normal limits. Vascular: Major intracranial vascular flow voids are maintained. Skull and upper cervical spine: Craniocervical junction within normal limits. Bone marrow signal intensity normal. No scalp soft tissue abnormality. Sinuses/Orbits: Globes and orbital soft tissues within normal limits. Mild mucosal thickening about the hypoplastic right sphenoid sinus. Paranasal sinuses are otherwise clear. No mastoid effusion. Other: Heterogeneous appearance of the parotid glands noted bilaterally. IMPRESSION: Normal brain MRI for age. No acute intracranial infarct or other abnormality. Electronically Signed   By: Rise Mu M.D.   On: 08/02/2023 02:30   CT ANGIO HEAD NECK W WO CM  Result Date: 08/01/2023 CLINICAL DATA:  52 year old male with left extremity weakness since 0730 hours. Hypertensive. EXAM: CT ANGIOGRAPHY HEAD AND NECK WITH AND WITHOUT CONTRAST TECHNIQUE: Multidetector CT imaging of the head and neck was performed using the standard protocol during bolus administration of intravenous contrast. Multiplanar CT  image reconstructions and MIPs were obtained to evaluate the vascular anatomy. Carotid stenosis measurements (when applicable) are obtained utilizing NASCET criteria, using the distal internal carotid diameter as the denominator. RADIATION DOSE REDUCTION: This exam was performed according to the departmental dose-optimization program which includes automated exposure control, adjustment of the mA and/or kV according to patient size and/or use of iterative reconstruction technique. CONTRAST:  75mL OMNIPAQUE IOHEXOL 350 MG/ML SOLN COMPARISON:  Outside brain MRI 04/24/2020 FINDINGS: CT HEAD Brain: Cerebral volume is within normal limits for age. No midline shift, ventriculomegaly, mass effect, evidence of mass lesion, intracranial hemorrhage or evidence of cortically based acute infarction. Gray-white differentiation within normal limits for age. Faint basal ganglia vascular calcifications on the left. No suspicious intracranial vascular hyperdensity. Calvarium and skull base: Negative. Paranasal sinuses: Visualized paranasal sinuses and mastoids are clear. Orbits: No gaze deviation. Partially visible asymmetric atrophy of the right parotid gland. Bilateral parotid sialolithiasis. Visualized scalp soft tissues are within normal limits. CTA NECK Skeleton: Impacted right mandible anterior alveolus tooth or supernumerary tooth. Cervical spine degeneration. No acute osseous abnormality identified. Upper chest: Negative. Other neck: Nodular atrophy of both submandibular glands, right parotid gland. Punctate parotid sialolithiasis. No acute finding. Aortic arch: 3 vessel arch.  No arch atherosclerosis. Right carotid system: Negative through the distal right CCA. Mild calcified plaque at the right ICA origin and bulb. No stenosis. Left carotid system: Negative.  No significant plaque or stenosis. Vertebral arteries: Proximal right subclavian artery and right vertebral artery origin are normal. Somewhat diminutive although  also dominant right vertebral artery is patent to the skull base with no plaque or stenosis. Negative proximal left subclavian artery. Highly diminutive left vertebral artery, but normal origin and the small vessel remains patent to the skull base with no plaque or stenosis identified. CTA HEAD Posterior circulation: Diminutive distal left vertebral artery terminates in PICA. Distal right vertebral artery supplies the basilar. Right PICA origin is patent. Patent although diminutive basilar artery with fetal type bilateral PCA origins. No basilar stenosis. Patent SCA Z and small bilateral P1 Speer it bilateral PCA branches are within normal limits. Anterior circulation: Both ICA siphons are patent. Mild left greater than right cavernous segment calcified plaque. No siphon stenosis. Normal posterior communicating artery origins. Patent carotid termini. Normal ophthalmic artery origins. Normal MCA and ACA origins. Diminutive anterior communicating artery. Bilateral ACA branches are within normal limits. Left MCA M1 segment and bifurcation are patent without stenosis.  Right MCA M1 segment and bifurcation are patent without stenosis. Bilateral MCA branches are within normal limits. Venous sinuses: Patent. Anatomic variants: Diminutive vertebrobasilar system on the basis of fetal type PCA origins. Dominant right and highly diminutive left vertebral, the left vertebral terminates in PICA. Review of the MIP images confirms the above findings IMPRESSION: 1. CTA is negative for large vessel occlusion. Mild for age atherosclerosis in the head and neck with no arterial stenosis. 2. Normal for age CT appearance of the brain. 3. 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 Electronically Signed   By: Odessa Fleming M.D.   On: 08/01/2023 10:57   DG Chest Portable 1 View  Result Date: 08/01/2023 CLINICAL DATA:  Chest pain. EXAM: PORTABLE  CHEST 1 VIEW COMPARISON:  Chest radiographs 06/11/2021 and 10/01/2018 FINDINGS: Cardiac silhouette and mediastinal contours within normal limits. Unchanged chronic left basilar curvilinear mild scar. No acute airspace opacity. No pleural effusion pneumothorax. No acute skeletal abnormality. IMPRESSION: No acute cardiopulmonary process. Electronically Signed   By: Neita Garnet M.D.   On: 08/01/2023 10:15     TODAY-DAY OF DISCHARGE:  Subjective:   Lee Reynolds today has no headache,no chest abdominal pain,no new weakness tingling or numbness, feels much better wants to go home today.   Objective:   Blood pressure 117/86, pulse 75, temperature 98.3 F (36.8 C), resp. rate 14, height 6\' 2"  (1.88 m), weight 122 kg, SpO2 98%.  Intake/Output Summary (Last 24 hours) at 08/02/2023 1330 Last data filed at 08/02/2023 0752 Gross per 24 hour  Intake 360.32 ml  Output --  Net 360.32 ml   Filed Weights   08/01/23 0851  Weight: 122 kg    Exam: Awake Alert, Oriented *3, No new F.N deficits, Normal affect [place].AT,PERRAL Supple Neck,No JVD, No cervical lymphadenopathy appriciated.  Symmetrical Chest wall movement, Good air movement bilaterally, CTAB RRR,No Gallops,Rubs or new Murmurs, No Parasternal Heave +ve B.Sounds, Abd Soft, Non tender, No organomegaly appriciated, No rebound -guarding or rigidity. No Cyanosis, Clubbing or edema, No new Rash or bruise   PERTINENT RADIOLOGIC STUDIES: ECHOCARDIOGRAM COMPLETE  Result Date: 08/02/2023    ECHOCARDIOGRAM REPORT   Patient Name:   Lee Reynolds Date of Exam: 08/02/2023 Medical Rec #:  [id number]      Height:       74.0 in Accession #:    [id number]     Weight:       269.0 lb Date of Birth:  01-23-1971       BSA:          2.465 m Patient Age:    52 years       BP:           108/69 mmHg Patient Gender: M              HR:           65 bpm. Exam Location:  Inpatient Procedure: 2D Echo, Cardiac Doppler and Color Doppler Indications:    TIA G45.9  History:         Patient has prior history of Echocardiogram examinations, most                 recent 10/18/2018. TIA, Signs/Symptoms:Chest Pain; Risk                 Factors:Dyslipidemia. CKD, stage 2.  Sonographer:    Lucendia Herrlich Referring Phys: [id number] Dewayne Shorter M Pascual Mantel IMPRESSIONS  1.  Left ventricular ejection fraction, by estimation, is 60 to 65%. The left ventricle has normal function. The left ventricle has no regional wall motion abnormalities. There is mild left ventricular hypertrophy. Left ventricular diastolic parameters were normal.  2. Right ventricular systolic function is normal. The right ventricular size is normal.  3. The mitral valve is grossly normal. No evidence of mitral valve regurgitation.  4. The aortic valve was not well visualized. Aortic valve regurgitation is not visualized. No aortic stenosis is present.  5. The inferior vena cava not well visualized. Conclusion(s)/Recommendation(s): Interatrial septum not well visualized, does not appear to have significant PFO. No other cardioembolic causes of stroke were identified. Can consider TEE with high suspicion for cardioembolic source. FINDINGS  Left Ventricle: Left ventricular ejection fraction, by estimation, is 60 to 65%. The left ventricle has normal function. The left ventricle has no regional wall motion abnormalities. The left ventricular internal cavity size was normal in size. There is  mild left ventricular hypertrophy. Left ventricular diastolic parameters were normal. Right Ventricle: The right ventricular size is normal. Right ventricular systolic function is normal. Left Atrium: Left atrial size was normal in size. Right Atrium: Right atrial size was normal in size. Pericardium: There is no evidence of pericardial effusion. Mitral Valve: The mitral valve is grossly normal. No evidence of mitral valve regurgitation. Tricuspid Valve: Tricuspid valve regurgitation is mild. Aortic Valve: The aortic valve was not well visualized. Aortic valve  regurgitation is not visualized. No aortic stenosis is present. Aortic valve peak gradient measures 7.7 mmHg. Pulmonic Valve: Pulmonic valve regurgitation is not visualized. Aorta: The aortic root and ascending aorta are structurally normal, with no evidence of dilitation. Venous: The inferior vena cava not well visualized. IAS/Shunts: The interatrial septum was not well visualized.  LEFT VENTRICLE PLAX 2D LVIDd:         4.05 cm   Diastology LVIDs:         2.55 cm   LV e' medial:    10.70 cm/s LV PW:         1.05 cm   LV E/e' medial:  9.0 LV IVS:        1.00 cm   LV e' lateral:   16.80 cm/s LVOT diam:     2.20 cm   LV E/e' lateral: 5.7 LV SV:         86 LV SV Index:   35 LVOT Area:     3.80 cm  RIGHT VENTRICLE             IVC RV S prime:     12.90 cm/s  IVC diam: 2.40 cm TAPSE (M-mode): 1.8 cm LEFT ATRIUM             Index        RIGHT ATRIUM           Index LA diam:        3.40 cm 1.38 cm/m   RA Area:     17.20 cm LA Vol (A2C):   47.6 ml 19.31 ml/m  RA Volume:   45.40 ml  18.42 ml/m LA Vol (A4C):   45.7 ml 18.54 ml/m LA Biplane Vol: 47.3 ml 19.19 ml/m  AORTIC VALVE AV Area (Vmax): 3.23 cm AV Vmax:        139.00 cm/s AV Peak Grad:   7.7 mmHg LVOT Vmax:      118.00 cm/s LVOT Vmean:     75.800 cm/s LVOT VTI:  0.226 m  AORTA Ao Root diam: 3.30 cm Ao Asc diam:  3.40 cm MITRAL VALVE               TRICUSPID VALVE MV Area (PHT): 3.77 cm    TR Peak grad:   19.9 mmHg MV Decel Time: 201 msec    TR Vmax:        223.00 cm/s MR Peak grad: 65.0 mmHg MR Vmax:      403.00 cm/s  SHUNTS MV E velocity: 95.90 cm/s  Systemic VTI:  0.23 m MV A velocity: 83.40 cm/s  Systemic Diam: 2.20 cm MV E/A ratio:  1.15 [place] signed by Carolan Clines Signature Date/Time: 08/02/2023/1:09:11 PM    Final    MR BRAIN WO CONTRAST  Result Date: 08/02/2023 CLINICAL DATA:  Follow-up examination for stroke. EXAM: MRI HEAD WITHOUT CONTRAST TECHNIQUE: Multiplanar, multiecho pulse sequences of the brain and surrounding structures  were obtained without intravenous contrast. COMPARISON:  Prior study from earlier the same day. FINDINGS: Brain: Cerebral volume within normal limits for age. Two adjacent subcentimeter foci of FLAIR hyperintensity noted involving the subcortical left frontal lobe (series 7, image 31), nonspecific, but of doubtful significance in the acute setting. No other significant cerebral white matter disease or other focal parenchymal signal abnormality. No evidence for acute or subacute ischemia. Gray-white matter differentiation maintained. No areas of chronic cortical infarction. No acute or chronic intracranial blood products. No mass lesion, midline shift or mass effect. No hydrocephalus or extra-axial fluid collection. Pituitary gland and suprasellar region within normal limits. Vascular: Major intracranial vascular flow voids are maintained. Skull and upper cervical spine: Craniocervical junction within normal limits. Bone marrow signal intensity normal. No scalp soft tissue abnormality. Sinuses/Orbits: Globes and orbital soft tissues within normal limits. Mild mucosal thickening about the hypoplastic right sphenoid sinus. Paranasal sinuses are otherwise clear. No mastoid effusion. Other: Heterogeneous appearance of the parotid glands noted bilaterally. IMPRESSION: Normal brain MRI for age. No acute intracranial infarct or other abnormality. Electronically Signed   By: Rise Mu M.D.   On: 08/02/2023 02:30   CT ANGIO HEAD NECK W WO CM  Result Date: 08/01/2023 CLINICAL DATA:  52 year old male with left extremity weakness since 0730 hours. Hypertensive. EXAM: CT ANGIOGRAPHY HEAD AND NECK WITH AND WITHOUT CONTRAST TECHNIQUE: Multidetector CT imaging of the head and neck was performed using the standard protocol during bolus administration of intravenous contrast. Multiplanar CT image reconstructions and MIPs were obtained to evaluate the vascular anatomy. Carotid stenosis measurements (when applicable) are  obtained utilizing NASCET criteria, using the distal internal carotid diameter as the denominator. RADIATION DOSE REDUCTION: This exam was performed according to the departmental dose-optimization program which includes automated exposure control, adjustment of the mA and/or kV according to patient size and/or use of iterative reconstruction technique. CONTRAST:  75mL OMNIPAQUE IOHEXOL 350 MG/ML SOLN COMPARISON:  Outside brain MRI 04/24/2020 FINDINGS: CT HEAD Brain: Cerebral volume is within normal limits for age. No midline shift, ventriculomegaly, mass effect, evidence of mass lesion, intracranial hemorrhage or evidence of cortically based acute infarction. Gray-white differentiation within normal limits for age. Faint basal ganglia vascular calcifications on the left. No suspicious intracranial vascular hyperdensity. Calvarium and skull base: Negative. Paranasal sinuses: Visualized paranasal sinuses and mastoids are clear. Orbits: No gaze deviation. Partially visible asymmetric atrophy of the right parotid gland. Bilateral parotid sialolithiasis. Visualized scalp soft tissues are within normal limits. CTA NECK Skeleton: Impacted right mandible anterior alveolus tooth or supernumerary tooth. Cervical spine degeneration.  No acute osseous abnormality identified. Upper chest: Negative. Other neck: Nodular atrophy of both submandibular glands, right parotid gland. Punctate parotid sialolithiasis. No acute finding. Aortic arch: 3 vessel arch.  No arch atherosclerosis. Right carotid system: Negative through the distal right CCA. Mild calcified plaque at the right ICA origin and bulb. No stenosis. Left carotid system: Negative.  No significant plaque or stenosis. Vertebral arteries: Proximal right subclavian artery and right vertebral artery origin are normal. Somewhat diminutive although also dominant right vertebral artery is patent to the skull base with no plaque or stenosis. Negative proximal left subclavian artery.  Highly diminutive left vertebral artery, but normal origin and the small vessel remains patent to the skull base with no plaque or stenosis identified. CTA HEAD Posterior circulation: Diminutive distal left vertebral artery terminates in PICA. Distal right vertebral artery supplies the basilar. Right PICA origin is patent. Patent although diminutive basilar artery with fetal type bilateral PCA origins. No basilar stenosis. Patent SCA Z and small bilateral P1 Speer it bilateral PCA branches are within normal limits. Anterior circulation: Both ICA siphons are patent. Mild left greater than right cavernous segment calcified plaque. No siphon stenosis. Normal posterior communicating artery origins. Patent carotid termini. Normal ophthalmic artery origins. Normal MCA and ACA origins. Diminutive anterior communicating artery. Bilateral ACA branches are within normal limits. Left MCA M1 segment and bifurcation are patent without stenosis. Right MCA M1 segment and bifurcation are patent without stenosis. Bilateral MCA branches are within normal limits. Venous sinuses: Patent. Anatomic variants: Diminutive vertebrobasilar system on the basis of fetal type PCA origins. Dominant right and highly diminutive left vertebral, the left vertebral terminates in PICA. Review of the MIP images confirms the above findings IMPRESSION: 1. CTA is negative for large vessel occlusion. Mild for age atherosclerosis in the head and neck with no arterial stenosis. 2. Normal for age CT appearance of the brain. 3. 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 Electronically Signed   By: Odessa Fleming M.D.   On: 08/01/2023 10:57   DG Chest Portable 1 View  Result Date: 08/01/2023 CLINICAL DATA:  Chest pain. EXAM: PORTABLE CHEST 1 VIEW COMPARISON:  Chest radiographs 06/11/2021 and 10/01/2018 FINDINGS: Cardiac silhouette and mediastinal contours within normal  limits. Unchanged chronic left basilar curvilinear mild scar. No acute airspace opacity. No pleural effusion pneumothorax. No acute skeletal abnormality. IMPRESSION: No acute cardiopulmonary process. Electronically Signed   By: Neita Garnet M.D.   On: 08/01/2023 10:15     PERTINENT LAB RESULTS: CBC: Recent Labs    08/01/23 0918 08/01/23 1610  WBC 2.8* 4.1  HGB 14.4 13.6  HCT 43.8 41.6  PLT 199 189   CMET CMP     Component Value Date/Time   NA 137 08/02/2023 0239   NA 139 07/09/2021 1026   K 3.8 08/02/2023 0239   CL 105 08/02/2023 0239   CO2 23 08/02/2023 0239   GLUCOSE 84 08/02/2023 0239   BUN 15 08/02/2023 0239   BUN 11 07/09/2021 1026   CREATININE 1.26 (H) 08/02/2023 0239   CALCIUM 8.8 (L) 08/02/2023 0239   PROT 6.4 (L) 08/02/2023 0239   ALBUMIN 3.2 (L) 08/02/2023 0239   AST 55 (H) 08/02/2023 0239   ALT 50 (H) 08/02/2023 0239   ALKPHOS 54 08/02/2023 0239   BILITOT 0.8 08/02/2023 0239   EGFR 71 07/09/2021 1026   GFRNONAA >60 08/02/2023 0239    GFR Estimated Creatinine Clearance: 94.1 mL/min (A) (  by C-G formula based on SCr of 1.26 mg/dL (H)). No results for input(s): "LIPASE", "AMYLASE" in the last 72 hours. No results for input(s): "CKTOTAL", "CKMB", "CKMBINDEX", "TROPONINI" in the last 72 hours. Invalid input(s): "POCBNP" No results for input(s): "DDIMER" in the last 72 hours. Recent Labs    08/01/23 1418  HGBA1C 5.9*   Recent Labs    08/02/23 0239  CHOL 117  HDL 29*  LDLCALC 76  TRIG 59  CHOLHDL 4.0   No results for input(s): "TSH", "T4TOTAL", "T3FREE", "THYROIDAB" in the last 72 hours.  Invalid input(s): "FREET3" No results for input(s): "VITAMINB12", "FOLATE", "FERRITIN", "TIBC", "IRON", "RETICCTPCT" in the last 72 hours. Coags: No results for input(s): "INR" in the last 72 hours.  Invalid input(s): "PT" Microbiology: No results found for this or any previous visit (from the past 240 hour(s)).  FURTHER DISCHARGE INSTRUCTIONS:  Get  Medicines reviewed and adjusted: Please take all your medications with you for your next visit with your Primary MD  Laboratory/radiological data: Please request your Primary MD to go over all hospital tests and procedure/radiological results at the follow up, please ask your Primary MD to get all Hospital records sent to his/her office.  In some cases, they will be blood work, cultures and biopsy results pending at the time of your discharge. Please request that your primary care M.D. goes through all the records of your hospital data and follows up on these results.  Also Note the following: If you experience worsening of your admission symptoms, develop shortness of breath, life threatening emergency, suicidal or homicidal thoughts you must seek medical attention immediately by calling 911 or calling your MD immediately  if symptoms less severe.  You must read complete instructions/literature along with all the possible adverse reactions/side effects for all the Medicines you take and that have been prescribed to you. Take any new Medicines after you have completely understood and accpet all the possible adverse reactions/side effects.   Do not drive when taking Pain medications or sleeping medications (Benzodaizepines)  Do not take more than prescribed Pain, Sleep and Anxiety Medications. It is not advisable to combine anxiety,sleep and pain medications without talking with your primary care practitioner  Special Instructions: If you have smoked or chewed Tobacco  in the last 2 yrs please stop smoking, stop any regular Alcohol  and or any Recreational drug use.  Wear Seat belts while driving.  Please note: You were cared for by a hospitalist during your hospital stay. Once you are discharged, your primary care physician will handle any further medical issues. Please note that NO REFILLS for any discharge medications will be authorized once you are discharged, as it is imperative that you  return to your primary care physician (or establish a relationship with a primary care physician if you do not have one) for your post hospital discharge needs so that they can reassess your need for medications and monitor your lab values.  Total Time spent coordinating discharge including counseling, education and face to face time equals greater than 30 minutes.  Signed: Shiann Kam 08/02/2023 1:30 PM

## 2023-08-02 NOTE — Plan of Care (Signed)
Problem: Education: Goal: Knowledge of General Education information will improve Description: Including pain rating scale, medication(s)/side effects and non-pharmacologic comfort measures 08/02/2023 1338 by Ronney Asters, Billee Cashing, RN Outcome: Completed/Met 08/02/2023 1338 by Ronney Asters, Billee Cashing, RN Outcome: Adequate for Discharge   Problem: Health Behavior/Discharge Planning: Goal: Ability to manage health-related needs will improve 08/02/2023 1338 by Ronney Asters, Billee Cashing, RN Outcome: Completed/Met 08/02/2023 1338 by Ronney Asters, Billee Cashing, RN Outcome: Adequate for Discharge   Problem: Clinical Measurements: Goal: Ability to maintain clinical measurements within normal limits will improve 08/02/2023 1338 by Ronney Asters, Billee Cashing, RN Outcome: Completed/Met 08/02/2023 1338 by Ronney Asters, Billee Cashing, RN Outcome: Adequate for Discharge Goal: Will remain free from infection 08/02/2023 1338 by Ronney Asters, Billee Cashing, RN Outcome: Completed/Met 08/02/2023 1338 by Ronney Asters, Billee Cashing, RN Outcome: Adequate for Discharge Goal: Diagnostic test results will improve 08/02/2023 1338 by Desmond Lope, RN Outcome: Completed/Met 08/02/2023 1338 by Ronney Asters, Billee Cashing, RN Outcome: Adequate for Discharge Goal: Respiratory complications will improve 08/02/2023 1338 by Desmond Lope, RN Outcome: Completed/Met 08/02/2023 1338 by Ronney Asters, Billee Cashing, RN Outcome: Adequate for Discharge Goal: Cardiovascular complication will be avoided 08/02/2023 1338 by Ronney Asters, Billee Cashing, RN Outcome: Completed/Met 08/02/2023 1338 by Desmond Lope, RN Outcome: Adequate for Discharge   Problem: Activity: Goal: Risk for activity intolerance will decrease 08/02/2023 1338 by Ronney Asters, Billee Cashing, RN Outcome: Completed/Met 08/02/2023 1338 by Ronney Asters, Billee Cashing, RN Outcome: Adequate for Discharge   Problem: Nutrition: Goal: Adequate nutrition will be maintained 08/02/2023 1338 by Ronney Asters, Billee Cashing, RN Outcome: Completed/Met 08/02/2023  1338 by Ronney Asters, Billee Cashing, RN Outcome: Adequate for Discharge   Problem: Coping: Goal: Level of anxiety will decrease 08/02/2023 1338 by Ronney Asters, Billee Cashing, RN Outcome: Completed/Met 08/02/2023 1338 by Ronney Asters, Billee Cashing, RN Outcome: Adequate for Discharge   Problem: Elimination: Goal: Will not experience complications related to bowel motility 08/02/2023 1338 by Ronney Asters, Billee Cashing, RN Outcome: Completed/Met 08/02/2023 1338 by Ronney Asters, Billee Cashing, RN Outcome: Adequate for Discharge Goal: Will not experience complications related to urinary retention 08/02/2023 1338 by Ronney Asters, Billee Cashing, RN Outcome: Completed/Met 08/02/2023 1338 by Desmond Lope, RN Outcome: Adequate for Discharge   Problem: Pain Managment: Goal: General experience of comfort will improve 08/02/2023 1338 by Ronney Asters, Billee Cashing, RN Outcome: Completed/Met 08/02/2023 1338 by Ronney Asters, Billee Cashing, RN Outcome: Adequate for Discharge   Problem: Safety: Goal: Ability to remain free from injury will improve 08/02/2023 1338 by Ronney Asters, Billee Cashing, RN Outcome: Completed/Met 08/02/2023 1338 by Ronney Asters, Billee Cashing, RN Outcome: Adequate for Discharge   Problem: Skin Integrity: Goal: Risk for impaired skin integrity will decrease 08/02/2023 1338 by Ronney Asters, Billee Cashing, RN Outcome: Completed/Met 08/02/2023 1338 by Ronney Asters, Billee Cashing, RN Outcome: Adequate for Discharge   Problem: Education: Goal: Knowledge of disease or condition will improve 08/02/2023 1338 by Ronney Asters, Billee Cashing, RN Outcome: Completed/Met 08/02/2023 1338 by Ronney Asters, Billee Cashing, RN Outcome: Adequate for Discharge Goal: Knowledge of secondary prevention will improve (MUST DOCUMENT ALL) 08/02/2023 1338 by Ronney Asters, Billee Cashing, RN Outcome: Completed/Met 08/02/2023 1338 by Ronney Asters, Billee Cashing, RN Outcome: Adequate for Discharge Goal: Knowledge of patient specific risk factors will improve Loraine Leriche N/A or DELETE if not current risk factor) 08/02/2023 1338 by Ronney Asters,  Billee Cashing, RN Outcome: Completed/Met 08/02/2023 1338 by Desmond Lope, RN Outcome: Adequate for Discharge   Problem: Ischemic Stroke/TIA Tissue Perfusion: Goal: Complications of ischemic stroke/TIA will be minimized 08/02/2023 1338 by Desmond Lope, RN Outcome: Completed/Met 08/02/2023 1338 by Desmond Lope, RN Outcome: Adequate  for Discharge   Problem: Coping: Goal: Will verbalize positive feelings about self 08/02/2023 1338 by Ronney Asters, Billee Cashing, RN Outcome: Completed/Met 08/02/2023 1338 by Ronney Asters, Billee Cashing, RN Outcome: Adequate for Discharge Goal: Will identify appropriate support needs 08/02/2023 1338 by Ronney Asters, Billee Cashing, RN Outcome: Completed/Met 08/02/2023 1338 by Ronney Asters, Billee Cashing, RN Outcome: Adequate for Discharge   Problem: Health Behavior/Discharge Planning: Goal: Ability to manage health-related needs will improve 08/02/2023 1338 by Ronney Asters, Billee Cashing, RN Outcome: Completed/Met 08/02/2023 1338 by Desmond Lope, RN Outcome: Adequate for Discharge Goal: Goals will be collaboratively established with patient/family 08/02/2023 1338 by Ronney Asters, Billee Cashing, RN Outcome: Completed/Met 08/02/2023 1338 by Desmond Lope, RN Outcome: Adequate for Discharge   Problem: Self-Care: Goal: Ability to participate in self-care as condition permits will improve 08/02/2023 1338 by Ronney Asters, Billee Cashing, RN Outcome: Completed/Met 08/02/2023 1338 by Ronney Asters, Billee Cashing, RN Outcome: Adequate for Discharge Goal: Verbalization of feelings and concerns over difficulty with self-care will improve 08/02/2023 1338 by Desmond Lope, RN Outcome: Completed/Met 08/02/2023 1338 by Ronney Asters, Billee Cashing, RN Outcome: Adequate for Discharge Goal: Ability to communicate needs accurately will improve 08/02/2023 1338 by Desmond Lope, RN Outcome: Completed/Met 08/02/2023 1338 by Ronney Asters, Billee Cashing, RN Outcome: Adequate for Discharge   Problem: Nutrition: Goal: Risk of aspiration  will decrease 08/02/2023 1338 by Desmond Lope, RN Outcome: Completed/Met 08/02/2023 1338 by Ronney Asters, Billee Cashing, RN Outcome: Adequate for Discharge Goal: Dietary intake will improve 08/02/2023 1338 by Desmond Lope, RN Outcome: Completed/Met 08/02/2023 1338 by Desmond Lope, RN Outcome: Adequate for Discharge

## 2023-08-19 ENCOUNTER — Ambulatory Visit
Payer: No Typology Code available for payment source | Admitting: Student in an Organized Health Care Education/Training Program

## 2023-08-25 ENCOUNTER — Encounter: Payer: Self-pay | Admitting: Student in an Organized Health Care Education/Training Program

## 2023-08-25 ENCOUNTER — Ambulatory Visit
Payer: No Typology Code available for payment source | Attending: Student in an Organized Health Care Education/Training Program | Admitting: Student in an Organized Health Care Education/Training Program

## 2023-08-25 VITALS — BP 127/76 | HR 72 | Temp 97.2°F | Ht 74.0 in | Wt 269.0 lb

## 2023-08-25 DIAGNOSIS — G8929 Other chronic pain: Secondary | ICD-10-CM | POA: Insufficient documentation

## 2023-08-25 DIAGNOSIS — M5416 Radiculopathy, lumbar region: Secondary | ICD-10-CM

## 2023-08-25 DIAGNOSIS — G894 Chronic pain syndrome: Secondary | ICD-10-CM | POA: Diagnosis not present

## 2023-08-25 DIAGNOSIS — M5442 Lumbago with sciatica, left side: Secondary | ICD-10-CM | POA: Diagnosis present

## 2023-08-25 DIAGNOSIS — M961 Postlaminectomy syndrome, not elsewhere classified: Secondary | ICD-10-CM

## 2023-08-25 MED ORDER — GABAPENTIN 300 MG PO CAPS
300.0000 mg | ORAL_CAPSULE | Freq: Two times a day (BID) | ORAL | 2 refills | Status: AC
Start: 2023-08-25 — End: 2023-11-23

## 2023-08-25 NOTE — Progress Notes (Signed)
Safety precautions to be maintained throughout the outpatient stay will include: orient to surroundings, keep bed in low position, maintain call bell within reach at all times, provide assistance with transfer out of bed and ambulation.  

## 2023-08-25 NOTE — Progress Notes (Signed)
Patient: Lee Reynolds  Service Category: E/M  Provider: Edward Jolly, MD  DOB: 04/14/1971  DOS: 08/25/2023  Referring Provider: Gardiner Rhyme  MRN: [id number]  Setting: Ambulatory outpatient  PCP: [name], MD  Type: New Patient  Specialty: Interventional Pain Management    Location: Office  Delivery: Face-to-face     Primary Reason(s) for Visit: Encounter for initial evaluation of one or more chronic problems (new to examiner) potentially causing chronic pain, and posing a threat to normal musculoskeletal function. (Level of risk: High) CC: Back Pain (lower)  HPI  [name] is a 52 y.o. year old, male patient, who comes for the first time to our practice referred by [name], PA-C for our initial evaluation of his chronic pain. He has Right hand pain; Trigger finger of right thumb; Asthma; Gout; GERD (gastroesophageal reflux disease); Hypercholesterolemia; Chest pain; Morbid obesity (HCC); TIA (transient ischemic attack); Leukopenia; Elevated blood pressure reading; CKD (chronic kidney disease) stage 2, GFR 60-89 ml/min; Transaminitis; Prediabetes; Obesity (BMI 30-39.9); Chronic radicular lumbar pain; Lumbar post-laminectomy syndrome; Chronic bilateral low back pain with left-sided sciatica; and Chronic pain syndrome on their problem list. Today he comes in for evaluation of his Back Pain (lower)  Pain Assessment: Location:   Back Radiating: Denies Onset: More than a month ago Duration: Chronic pain Quality: Aching, Constant, Sharp, Stabbing, Throbbing Severity: 4 /10 (subjective, self-reported pain score)  Effect on ADL: limits my daily activities Timing: Constant Modifying factors: laying down, ice BP: 127/76  HR: 72  Onset and Duration: Present longer than 3 months Cause of pain:  Back Surgery 2009 Severity: No change since onset, NAS-11 at its worse: 7/10, NAS-11 at its best: 3/10, NAS-11 now: 4/10, and NAS-11 on the average: 5/10 Timing: Not influenced by the time  of the day Aggravating Factors: Bending and Prolonged sitting Alleviating Factors: Stretching, Relaxation therapy, and Warm showers or baths Associated Problems: Numbness, Spasms, and Weakness Quality of Pain: Aching, Annoying, Sharp, and Shooting Previous Examinations or Tests: CT scan, MRI scan, X-rays, Neurological evaluation, Neurosurgical evaluation, Orthopedic evaluation, and Chiropractic evaluation Previous Treatments: Chiropractic manipulations, Physical Therapy, and Relaxation therapy  Mr. Moriarty is being evaluated for possible interventional pain management therapies for the treatment of his chronic pain.   Lee Reynolds is a pleasant 52 year old male who presents with a chief complaint of low back pain which radiates down his left posterior leg to his foot.  He also endorses numbness and tingling in his left foot.  He denies any weakness.  Denies any right leg pain.  He does endorse bilateral low back pain.  Of note he had a L4-L5 decompression in 2009.  He would like to avoid another surgery if he can.  He is complaining of pain with increased exertion.  He has done physical therapy in the past and is doing a home exercise program that was recommended by neurosurgery.  He has had a lumbar MRI completed, results of which are below.  He denies any bowel or bladder continence.  Meds   Current Outpatient Medications:    Albuterol Sulfate 108 (90 Base) MCG/ACT AEPB, Inhale 1 puff into the lungs as needed., Disp: , Rfl:    allopurinol (ZYLOPRIM) 300 MG tablet, TAKE 1 TABLET BY MOUTH ONCE DAILY FOR 90 DAYS, Disp: , Rfl: 1   aspirin EC 81 MG tablet, Take 81 mg by mouth daily. Swallow whole., Disp: , Rfl:    atorvastatin (LIPITOR) 80 MG tablet, Take 80 mg by mouth daily., Disp: ,  Rfl:    clopidogrel (PLAVIX) 75 MG tablet, Take 1 tablet (75 mg total) by mouth daily., Disp: 30 tablet, Rfl: 1   cyclobenzaprine (FLEXERIL) 10 MG tablet, Take 1 tablet by mouth 3 (three) times daily as needed., Disp: , Rfl:     famotidine (PEPCID) 40 MG tablet, Take 1 tablet by mouth at bedtime. PRN, Disp: , Rfl:    fluticasone (FLONASE) 50 MCG/ACT nasal spray, Place 1 spray into both nostrils daily. PRN, Disp: , Rfl:    gabapentin (NEURONTIN) 300 MG capsule, Take 1 capsule (300 mg total) by mouth 2 (two) times daily., Disp: 60 capsule, Rfl: 2   ibuprofen (ADVIL) 200 MG tablet, Take 200 mg by mouth every 6 (six) hours as needed., Disp: , Rfl:    indomethacin (INDOCIN) 25 MG capsule, Take 1 capsule by mouth 2 (two) times daily. PRN, Disp: , Rfl:    loratadine (CLARITIN) 10 MG tablet, Take 10 mg by mouth daily., Disp: , Rfl:    ZEPBOUND 2.5 MG/0.5ML Pen, Inject 2.5 mg into the skin once a week., Disp: , Rfl:   Imaging Review   Narrative Addendum Begins  This exam was initially entered by the technologist as a cervical spine order.  For this reason, the [facility] voice recognition software populated the report with a cervical spine template.  The study is actually a lumbar spine exam and should be titled as follows:  LUMBAR SPINE -  2 - 3 VIEW  The films obtained were of the lumbar spine and the report below is appropriate for the lumbar spine study.   Addendum Ends  Clinical Data: L4-5 laminectomy discectomy.  CERVICAL SPINE - 2-3 VIEW  Comparison: None.  Findings: Cross-table portable view of the lumbar spine obtained portably at 1210 hours shows a spinal needle in the posterior soft tissues of the lower back.  The needle is located just caudal to the L4 spinous process.  The second cross-table portable film was obtained 1220 hours.  This shows soft tissue retractors in the lower back.  A radiopaque surgical probe is positioned at the level of the L4-5 facets.  IMPRESSION: Intraoperative localization.  Provider: Dixon Boos Saganich   MR SHOULDER RIGHT WO CONTRAST  Narrative CLINICAL DATA:  Right shoulder pain, soreness and limited range of motion for 8 months.  EXAM: MRI OF  THE RIGHT SHOULDER WITHOUT CONTRAST  TECHNIQUE: Multiplanar, multisequence MR imaging of the shoulder was performed. No intravenous contrast was administered.  COMPARISON:  None.  FINDINGS: Rotator cuff: Intact. Mild supraspinatus and infraspinatus tendinopathy noted.  Muscles:  Normal.  No atrophy or focal lesion.  Biceps long head:  Intact.  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 Type 1 acromion. No subacromial/subdeltoid bursal fluid.  Glenohumeral Joint: Appears normal.  Labrum:  Intact.  Bones:  No fracture or worrisome lesion.  Other: None.  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  Mild appearing supraspinatus and infraspinatus tendinopathy without tear.   Electronically Signed By: Drusilla Kanner M.D. On: 01/21/2021 08:58   MR LUMBAR SPINE WO CONTRAST  Narrative CLINICAL DATA:  52 year old male with prior surgery. Low back pain, left foot numbness.  EXAM: MRI LUMBAR SPINE WITHOUT CONTRAST  TECHNIQUE: Multiplanar, multisequence MR imaging of the lumbar spine was performed. No intravenous contrast was administered.  COMPARISON:  Lumbar MRI 10/11/2014.  FINDINGS: Segmentation: Lumbar  segmentation appears to be normal and is the same numbering system used in 2015.  Alignment: Chronic straightening of lumbar lordosis. No significant scoliosis or spondylolisthesis.  Vertebrae: Chronic degenerative endplate marrow signal changes at L4-L5, with small new L5 superior endplate Schmorl's node but largely resolved degenerative endplate marrow edema at that level since 2015. Background bone marrow signal is within normal limits. No acute osseous abnormality identified. Intact visible sacrum.  Conus medullaris and cauda equina: Conus  extends to the T12-L1 level. No lower spinal cord or conus signal abnormality.  Paraspinal and other soft tissues: Negative aside from mild chronic postoperative changes to the posterior paraspinal soft tissues at L4-L5.  Disc levels:  Visible lower thoracic levels through L1-L2 remain negative.  L2-L3: Disc space loss since 2015 and mild disc desiccation. Mild new circumferential disc bulge. Mild posterior element hypertrophy and epidural lipomatosis. New borderline to mild spinal stenosis (series 105, image 9). And mild to moderate new left L2 neural foraminal stenosis, in part related to broad-based left foraminal disc (series 13, image 17).  L3-L4: Chronic disc desiccation, mild circumferential disc bulging and mild posterior element hypertrophy is stable. Mild epidural lipomatosis at this level is stable to mildly regressed. No spinal stenosis. And borderline to mild mostly left side L3 foraminal stenosis is stable.  L4-L5: Chronic severe disc space loss. Circumferential disc osteophyte complex. Chronic postoperative changes to the left lamina. Mild residual posterior element hypertrophy. No spinal stenosis. Mild to moderate right lateral recess stenosis related to broad-based rightward disc osteophyte complex is new (right L5 nerve level series 113, image 29). Mild left L4 foraminal stenosis is stable. Moderate to severe right L4 foraminal stenosis appears chronic but progressed on series 105, image 5.  L5-S1: Chronic disc space loss and circumferential disc osteophyte complex with mild facet hypertrophy. No spinal stenosis. No convincing lateral recess stenosis. And mild left greater than right L5 foraminal stenosis appears stable since 2015.  IMPRESSION: 1. The symptomatic level with regard to left side radiating pain might be L2-L3, where there is up to mild new multifactorial spinal stenosis and mild to moderate new left foraminal stenosis since the 2015 MRI  related to broad-based disc and endplate spurring. Query left L2 radiculitis. 2. Otherwise chronic postoperative changes at L4-L5 with new and/or increased right lateral recess and right foraminal stenosis at that level (moderate to severe). 3. And essentially stable lumbar spine degeneration elsewhere. Mild left L3, L4, and left greater than right L5 neural foraminal stenosis.   Electronically Signed By: Odessa Fleming M.D. On: 07/04/2023 14:25  Lumbar MR w/wo contrast: Results for orders placed during the hospital encounter of 10/11/14  MR Lumbar Spine W Wo Contrast  Narrative CLINICAL DATA:  Initial evaluation for low back pain for 2 months. Left leg and foot numbness. Prior surgery in 2009.  EXAM: MRI LUMBAR SPINE WITHOUT AND WITH CONTRAST  TECHNIQUE: Multiplanar and multiecho pulse sequences of the lumbar spine were obtained without and with intravenous contrast.  CONTRAST:  20mL MULTIHANCE GADOBENATE DIMEGLUMINE 529 MG/ML IV SOLN  COMPARISON:  Prior radiograph from 10/10/2008.  FINDINGS: For the purposes of this dictation, the lowest well-formed intervertebral disc spaces presumed to be the L5-S1 level, and there presumed to be 5 lumbar type vertebral bodies.  The vertebral bodies are normally aligned with preservation of the normal lumbar lordosis. Vertebral body heights are preserved. Signal intensity within the vertebral body bone marrow is normal. No focal osseous lesion.  Conus medullaris terminates normally at the T12-L1  level. Nerve roots of the cauda equina are within normal limits.  Paraspinous soft tissues are unremarkable.  T12-L1:  Negative.  L1-2: Mild degenerative disc desiccation without disc bulge or disc protrusion. Mild bilateral facet hypertrophy. No canal or foraminal stenosis.  L2-3: Minimal disc bulge without focal disc protrusion. There is mild bilateral facet hypertrophy, left slightly greater than right. No significant canal or foraminal  stenosis.  L3-4: Degenerative disc bulge with disc desiccation and mild intervertebral disc space narrowing. There is moderate bilateral facet hypertrophy with ligamentum flavum thickening. Tiny superimposed left paracentral disc protrusion, stable from prior. Mild canal stenosis related to facet hypertrophy and mild disc bulge present. Mild bilateral foraminal stenosis present.  L4-5: Diffuse disc bulge with prominent degenerative endplate changes. Postoperative changes from remote decompressive left hemi laminectomy. Previously seen large left paracentral disc protrusion at this level is no longer visualized. There is minimal enhancing scar tissue within the left lateral epidural space without significant nerve compression. Moderate bilateral facet hypertrophy. The right ligamentum flavum is thickened. The thecal sac is widely patent. There is mild left with moderate right foraminal stenosis.  L5-S1: Diffuse disc bulge with disc desiccation and intervertebral disc space narrowing. There is superimposed broad-based posterior disc protrusion, slightly eccentric to the right. Moderate bilateral facet arthrosis, slightly worse on the left. No significant canal stenosis. There is mild bilateral foraminal narrowing, left slightly worse than right.  No abnormal enhancement within the lumbar spine.  IMPRESSION: 1. Postoperative changes from remote decompressive left hemi laminectomy at L4-5. There is disc bulge with prominent degenerative endplate changes at this level without recurrent disc herniation or significant canal stenosis. There is moderate right with mild left foraminal narrowing. 2. Disc bulge with superimposed shallow central disc protrusion and moderate bilateral facet arthrosis at L5-S1 with resultant mild bilateral foraminal narrowing, left slightly worse than right. 3. Disc bulge with facet hypertrophy and tiny superimposed left paracentral disc protrusion at L3-4 with  resultant mild canal and bilateral foraminal stenosis.   Electronically Signed By: Rise Mu M.D. On: 10/12/2014 06:48   Narrative Clinical Data: Disc herniation.  Lumbar fusion.  LUMBAR SPINE - 2-3 VIEW  Comparison: None available.  Findings: We are provided with two-view portable intraoperative views of the lumbar spine in the lateral projection.  On film labeled #1 at 1210 hours, a metallic probe is in the soft tissues of the back directed toward the L4-5 level.  On film labeled #2, tissues spreaders are in place with one metallic probe at the L4-5 level.  IMPRESSION: L4-5 localization.  Provider: Moss Mc   DG Epidurography  Narrative Clinical Data:  Left lower extremity pain.  No previous lumbar surgery.  MR demonstrates large leftward protrusion L4-5.  Procedure: The procedure, risks, benefits, and alternatives were explained to the patient. Questions regarding the procedure were encouraged and answered. The patient understands and consents to the procedure.  LUMBAR EPIDURAL INJECTION: An interlaminar approach was performed on the left at L5-S1 .  The overlying skin was cleansed and anesthetized.  A 20 gauge Crawford epidural needle was advanced using loss-of-resistance technique.  DIAGNOSTIC EPIDURAL INJECTION: Injection of Omnipaque 180 shows a good epidural pattern with spread above and below the level of needle placement. No vascular opacification is seen.  THERAPEUTIC EPIDURAL INJECTION: 120mg  of Depo-Medrol mixed with 5ml lidocaine 1% were instilled.  The procedure was well-tolerated, and the patient was discharged thirty minutes following the injection in good condition.  Fluoroscopy Time: 16 seconds  Complications: none  Impression Technically successful epidural injection on the left at L5-S1.  Provider: Romie Minus    Complexity Note: Imaging results reviewed.                         ROS  Cardiovascular: Daily  Aspirin intake Pulmonary or Respiratory: No reported pulmonary signs or symptoms such as wheezing and difficulty taking a deep full breath (Asthma), difficulty blowing air out (Emphysema), coughing up mucus (Bronchitis), persistent dry cough, or temporary stoppage of breathing during sleep Neurological: No reported neurological signs or symptoms such as seizures, abnormal skin sensations, urinary and/or fecal incontinence, being born with an abnormal open spine and/or a tethered spinal cord Psychological-Psychiatric: No reported psychological or psychiatric signs or symptoms such as difficulty sleeping, anxiety, depression, delusions or hallucinations (schizophrenial), mood swings (bipolar disorders) or suicidal ideations or attempts Gastrointestinal: No reported gastrointestinal signs or symptoms such as vomiting or evacuating blood, reflux, heartburn, alternating episodes of diarrhea and constipation, inflamed or scarred liver, or pancreas or irrregular and/or infrequent bowel movements Genitourinary: No reported renal or genitourinary signs or symptoms such as difficulty voiding or producing urine, peeing blood, non-functioning kidney, kidney stones, difficulty emptying the bladder, difficulty controlling the flow of urine, or chronic kidney disease Hematological: No reported hematological signs or symptoms such as prolonged bleeding, low or poor functioning platelets, bruising or bleeding easily, hereditary bleeding problems, low energy levels due to low hemoglobin or being anemic Endocrine: No reported endocrine signs or symptoms such as high or low blood sugar, rapid heart rate due to high thyroid levels, obesity or weight gain due to slow thyroid or thyroid disease Rheumatologic: No reported rheumatological signs and symptoms such as fatigue, joint pain, tenderness, swelling, redness, heat, stiffness, decreased range of motion, with or without associated rash Musculoskeletal: Negative for myasthenia  gravis, muscular dystrophy, multiple sclerosis or malignant hyperthermia Work History: Working full time  Allergies  Mr. Crogan is allergic to protonix [pantoprazole].  Laboratory Chemistry Profile   Renal Lab Results  Component Value Date   BUN 15 08/02/2023   CREATININE 1.26 (H) 08/02/2023   BCR 9 07/09/2021   GFRAA  10/10/2008    >60        The eGFR has been calculated using the MDRD equation. This calculation has not been validated in all clinical   GFRNONAA >60 08/02/2023   PROTEINUR TRACE (A) 08/01/2023     Electrolytes Lab Results  Component Value Date   NA 137 08/02/2023   K 3.8 08/02/2023   CL 105 08/02/2023   CALCIUM 8.8 (L) 08/02/2023     Hepatic Lab Results  Component Value Date   AST 55 (H) 08/02/2023   ALT 50 (H) 08/02/2023   ALBUMIN 3.2 (L) 08/02/2023   ALKPHOS 54 08/02/2023     ID Lab Results  Component Value Date   HIV Non Reactive 08/01/2023     Bone No results found for: "VD25OH", "VD125OH2TOT", "ZO1096EA5", "WU9811BJ4", "25OHVITD1", "25OHVITD2", "25OHVITD3", "TESTOFREE", "TESTOSTERONE"   Endocrine Lab Results  Component Value Date   GLUCOSE 84 08/02/2023   GLUCOSEU NEGATIVE 08/01/2023   HGBA1C 5.9 (H) 08/01/2023     Neuropathy Lab Results  Component Value Date   HGBA1C 5.9 (H) 08/01/2023   HIV Non Reactive 08/01/2023     CNS No results found for: "COLORCSF", "APPEARCSF", "RBCCOUNTCSF", "WBCCSF", "POLYSCSF", "LYMPHSCSF", "EOSCSF", "PROTEINCSF", "GLUCCSF", "JCVIRUS", "CSFOLI", "IGGCSF", "LABACHR", "ACETBL"   Inflammation (CRP: Acute  ESR: Chronic) No results found for: "CRP", "ESRSEDRATE", "LATICACIDVEN"  Rheumatology No results found for: "RF", "ANA", "LABURIC", "URICUR", "LYMEIGGIGMAB", "LYMEABIGMQN", "HLAB27"   Coagulation Lab Results  Component Value Date   INR 0.9 10/10/2008   LABPROT 12.5 10/10/2008   APTT 32 10/10/2008   PLT 189 08/01/2023     Cardiovascular Lab Results  Component Value Date   HGB 13.6  08/01/2023   HCT 41.6 08/01/2023     Screening Lab Results  Component Value Date   HIV Non Reactive 08/01/2023     Cancer No results found for: "CEA", "CA125", "LABCA2"   Allergens No results found for: "ALMOND", "APPLE", "ASPARAGUS", "AVOCADO", "BANANA", "BARLEY", "BASIL", "BAYLEAF", "GREENBEAN", "LIMABEAN", "WHITEBEAN", "BEEFIGE", "REDBEET", "BLUEBERRY", "BROCCOLI", "CABBAGE", "MELON", "CARROT", "CASEIN", "CASHEWNUT", "CAULIFLOWER", "CELERY"     Note: Lab results reviewed.  PFSH  Drug: Mr. Yarber  reports no history of drug use. Alcohol:  reports no history of alcohol use. Tobacco:  reports that he quit smoking about 3 years ago. His smoking use included cigars. He has never used smokeless tobacco. Medical:  has a past medical history of Asthma (10/11/2018), GERD (gastroesophageal reflux disease) (10/11/2018), Gout (10/11/2018), Headache, Hypercholesterolemia (10/11/2018), Hyperlipemia, Right hand pain (06/24/2018), and Trigger finger of right thumb (06/24/2018). Family: family history includes Cancer in his father; Heart disease in his maternal grandmother; Hyperlipidemia in his mother.  Past Surgical History:  Procedure Laterality Date   BACK SURGERY  10/2008   L4,L5 discectomy   Active Ambulatory Problems    Diagnosis Date Noted   Right hand pain 06/24/2018   Trigger finger of right thumb 06/24/2018   Asthma 10/11/2018   Gout 10/11/2018   GERD (gastroesophageal reflux disease) 10/11/2018   Hypercholesterolemia 10/11/2018   Chest pain 10/11/2018   Morbid obesity (HCC) 10/11/2018   TIA (transient ischemic attack) 08/01/2023   Leukopenia 08/01/2023   Elevated blood pressure reading 08/01/2023   CKD (chronic kidney disease) stage 2, GFR 60-89 ml/min 08/01/2023   Transaminitis 08/01/2023   Prediabetes 08/01/2023   Obesity (BMI 30-39.9) 08/01/2023   Chronic radicular lumbar pain 08/25/2023   Lumbar post-laminectomy syndrome 08/25/2023   Chronic bilateral low back pain with  left-sided sciatica 08/25/2023   Chronic pain syndrome 08/25/2023   Resolved Ambulatory Problems    Diagnosis Date Noted   No Resolved Ambulatory Problems   Past Medical History:  Diagnosis Date   Headache    Hyperlipemia    Constitutional Exam  General appearance: Well nourished, well developed, and well hydrated. In no apparent acute distress Vitals:   08/25/23 1414  BP: 127/76  Pulse: 72  Temp: (!) 97.2 F (36.2 C)  SpO2: 100%  Weight: 269 lb (122 kg)  Height: 6\' 2"  (1.88 m)   BMI Assessment: Estimated body mass index is 34.54 kg/m as calculated from the following:   Height as of this encounter: 6\' 2"  (1.88 m).   Weight as of this encounter: 269 lb (122 kg).  BMI interpretation table: BMI level Category Range association with higher incidence of chronic pain  <18 kg/m2 Underweight   18.5-24.9 kg/m2 Ideal body weight   25-29.9 kg/m2 Overweight Increased incidence by 20%  30-34.9 kg/m2 Obese (Class I) Increased incidence by 68%  35-39.9 kg/m2 Severe obesity (Class II) Increased incidence by 136%  >40 kg/m2 Extreme obesity (Class III) Increased incidence by 254%   Patient's current BMI Ideal Body weight  Body mass index is 34.54 kg/m. Ideal body weight: 82.2 kg (181 lb 3.5 oz) Adjusted ideal body weight: 98.1 kg (216 lb 5.3 oz)   BMI Readings from Last  4 Encounters:  08/25/23 34.54 kg/m  08/01/23 34.54 kg/m  06/16/23 39.47 kg/m  01/15/22 40.40 kg/m   Wt Readings from Last 4 Encounters:  08/25/23 269 lb (122 kg)  08/01/23 269 lb (122 kg)  06/16/23 299 lb 3.2 oz (135.7 kg)  01/15/22 (!) 306 lb 3.2 oz (138.9 kg)    Psych/Mental status: Alert, oriented x 3 (person, place, & time)       Eyes: PERLA Respiratory: No evidence of acute respiratory distress  Lumbar Spine Area Exam  Skin & Axial Inspection: No masses, redness, or swelling Alignment: Symmetrical Functional ROM: Pain restricted ROM affecting primarily the left Stability: No instability  detected Muscle Tone/Strength: Functionally intact. No obvious neuro-muscular anomalies detected. Sensory (Neurological): Dermatomal pain pattern  Palpation: No palpable anomalies       Provocative Tests: Hyperextension/rotation test: (+) due to pain. Lumbar quadrant test (Kemp's test): (+) on the left for foraminal stenosis  Gait & Posture Assessment  Ambulation: Unassisted Gait: Relatively normal for age and body habitus Posture: WNL  Lower Extremity Exam    Side: Right lower extremity  Side: Left lower extremity  Stability: No instability observed          Stability: No instability observed          Skin & Extremity Inspection: Skin color, temperature, and hair growth are WNL. No peripheral edema or cyanosis. No masses, redness, swelling, asymmetry, or associated skin lesions. No contractures.  Skin & Extremity Inspection: Skin color, temperature, and hair growth are WNL. No peripheral edema or cyanosis. No masses, redness, swelling, asymmetry, or associated skin lesions. No contractures.  Functional ROM: Unrestricted ROM                  Functional ROM: Unrestricted ROM                  Muscle Tone/Strength: Functionally intact. No obvious neuro-muscular anomalies detected.  Muscle Tone/Strength: Functionally intact. No obvious neuro-muscular anomalies detected.  Sensory (Neurological): Unimpaired        Sensory (Neurological): Dermatomal pain pattern        DTR: Patellar: deferred today Achilles: deferred today Plantar: deferred today  DTR: Patellar: deferred today Achilles: deferred today Plantar: deferred today  Palpation: No palpable anomalies  Palpation: No palpable anomalies    Assessment  Primary Diagnosis & Pertinent Problem List: The primary encounter diagnosis was Lumbar radiculopathy (Left L2, L3). Diagnoses of Chronic bilateral low back pain with left-sided sciatica, Chronic radicular lumbar pain, Lumbar post-laminectomy syndrome, and Chronic pain syndrome were also  pertinent to this visit.  Visit Diagnosis (New problems to examiner): 1. Lumbar radiculopathy (Left L2, L3)   2. Chronic bilateral low back pain with left-sided sciatica   3. Chronic radicular lumbar pain   4. Lumbar post-laminectomy syndrome   5. Chronic pain syndrome    Plan of Care (Initial workup plan)  I reviewed MRI with patient in detail.  He has moderate left L2 foraminal stenosis related to the broad-based left foraminal disc and mild left L3 foraminal stenosis.  We discussed a left L2 and L3 transforaminal ESI.  Risk and benefits of this were reviewed and patient would like to proceed.  I also recommend that he start gabapentin as below.  Continue with home exercise program  We also briefly discussed spinal cord stimulation as the patient would like to avoid any additional surgeries.  Procedure Orders         Lumbar Transforaminal Epidural     Pharmacotherapy (  current): Medications ordered:  Meds ordered this encounter  Medications   gabapentin (NEURONTIN) 300 MG capsule    Sig: Take 1 capsule (300 mg total) by mouth 2 (two) times daily.    Dispense:  60 capsule    Refill:  2   Medications administered during this visit: Margaretha Seeds had no medications administered during this visit.   Provider-requested follow-up: Return in about 2 weeks (around 09/08/2023) for Left L2 and L3 TF ESI, in clinic [facility].  Future Appointments  Date Time Provider Department Center  09/15/2023  8:30 AM [name], PA-C [facility] None  09/29/2023 10:00 AM Penumalli, Glenford Bayley, MD [facility] None    Duration of encounter: .  Total time on encounter, as per AMA guidelines included both the face-to-face and non-face-to-face time personally spent by the physician and/or other qualified health care professional(s) on the day of the encounter (includes time in activities that require the physician or other qualified health care professional and does not include time in activities normally  performed by clinical staff). Physician's time may include the following activities when performed: Preparing to see the patient (e.g., pre-charting review of records, searching for previously ordered imaging, lab work, and nerve conduction tests) Review of prior analgesic pharmacotherapies. Reviewing PMP Interpreting ordered tests (e.g., lab work, imaging, nerve conduction tests) Performing post-procedure evaluations, including interpretation of diagnostic procedures Obtaining and/or reviewing separately obtained history Performing a medically appropriate examination and/or evaluation Counseling and educating the patient/family/caregiver Ordering medications, tests, or procedures Referring and communicating with other health care professionals (when not separately reported) Documenting clinical information in the electronic or other health record Independently interpreting results (not separately reported) and communicating results to the patient/ family/caregiver Care coordination (not separately reported)  Note by: Edward Jolly, MD (TTS technology used. I apologize for any typographical errors that were not detected and corrected.) Date: 08/25/2023; Time: 4:01 PM

## 2023-08-25 NOTE — Patient Instructions (Signed)
Procedure instructions  Do not eat or drink fluids (other than water) for 6 hours before your procedure  No water for 2 hours before your procedure  Take your blood pressure medicine with a sip of water  Arrive 30 minutes before your appointment  Carefully read the "Preparing for your procedure" detailed instructions  If you have questions call us at [phone]  _____________________________________________________________________    ______________________________________________________________________  Preparing for your procedure  Appointments: If you think you may not be able to keep your appointment, call 24-48 hours in advance to cancel. We need time to make it available to others.  During your procedure appointment there will be: No Prescription Refills. No disability issues to discussed. No medication changes or discussions.  Instructions: Food intake: Avoid eating anything solid for at least 8 hours prior to your procedure. Clear liquid intake: You may take clear liquids such as water up to 2 hours prior to your procedure. (No carbonated drinks. No soda.) Transportation: Unless otherwise stated by your physician, bring a driver. Morning Medicines: Except for blood thinners, take all of your other morning medications with a sip of water. Make sure to take your heart and blood pressure medicines. If your blood pressure's lower number is above 100, the case will be rescheduled. Blood thinners: Make sure to stop your blood thinners as instructed.  If you take a blood thinner, but were not instructed to stop it, call our office [phone] and ask to talk to a nurse. Not stopping a blood thinner prior to certain procedures could lead to serious complications. Diabetics on insulin: Notify the staff so that you can be scheduled 1st case in the morning. If your diabetes requires high dose insulin, take only  of your normal insulin dose the morning of the procedure and  notify the staff that you have done so. Preventing infections: Shower with an antibacterial soap the morning of your procedure.  Build-up your immune system: Take 1000 mg of Vitamin C with every meal (3 times a day) the day prior to your procedure. Antibiotics: Inform the nursing staff if you are taking any antibiotics or if you have any conditions that may require antibiotics prior to procedures. (Example: recent joint implants)   Pregnancy: If you are pregnant make sure to notify the nursing staff. Not doing so may result in injury to the fetus, including death.  Sickness: If you have a cold, fever, or any active infections, call and cancel or reschedule your procedure. Receiving steroids while having an infection may result in complications. Arrival: You must be in the facility at least 30 minutes prior to your scheduled procedure. Tardiness: Your scheduled time is also the cutoff time. If you do not arrive at least 15 minutes prior to your procedure, you will be rescheduled.  Children: Do not bring any children with you. Make arrangements to keep them home. Dress appropriately: There is always a possibility that your clothing may get soiled. Avoid long dresses. Valuables: Do not bring any jewelry or valuables.  Epidural Steroid Injection Patient Information  Description: The epidural space surrounds the nerves as they exit the spinal cord.  In some patients, the nerves can be compressed and inflamed by a bulging disc or a tight spinal canal (spinal stenosis).  By injecting steroids into the epidural space, we can bring irritated nerves into direct contact with a potentially helpful medication.  These steroids act directly on the irritated nerves and can reduce swelling and inflammation which often leads to  decreased pain.  Epidural steroids may be injected anywhere along the spine and from the neck to the low back depending upon the location of your pain.   After numbing the skin with local  anesthetic (like Novocaine), a small needle is passed into the epidural space slowly.  You may experience a sensation of pressure while this is being done.  The entire block usually last less than 10 minutes.  Conditions which may be treated by epidural steroids:  Low back and leg pain Neck and arm pain Spinal stenosis Post-laminectomy syndrome Herpes zoster (shingles) pain Pain from compression fractures  Preparation for the injection:  Do not eat any solid food or dairy products within 8 hours of your appointment.  You may drink clear liquids up to 3 hours before appointment.  Clear liquids include water, black coffee, juice or soda.  No milk or cream please. You may take your regular medication, including pain medications, with a sip of water before your appointment  Diabetics should hold regular insulin (if taken separately) and take 1/2 normal NPH dos the morning of the procedure.  Carry some sugar containing items with you to your appointment. A driver must accompany you and be prepared to drive you home after your procedure.  Bring all your current medications with your. An IV may be inserted and sedation may be given at the discretion of the physician.   A blood pressure cuff, EKG and other monitors will often be applied during the procedure.  Some patients may need to have extra oxygen administered for a short period. You will be asked to provide medical information, including your allergies, prior to the procedure.  We must know immediately if you are taking blood thinners (like Coumadin/Warfarin)  Or if you are allergic to IV iodine contrast (dye). We must know if you could possible be pregnant.  Possible side-effects: Bleeding from needle site Infection (rare, may require surgery) Nerve injury (rare) Numbness & tingling (temporary) Difficulty urinating (rare, temporary) Spinal headache ( a headache worse with upright posture) Light -headedness (temporary) Pain at injection  site (several days) Decreased blood pressure (temporary) Weakness in arm/leg (temporary) Pressure sensation in back/neck (temporary)  Call if you experience: Fever/chills associated with headache or increased back/neck pain. Headache worsened by an upright position. New onset weakness or numbness of an extremity below the injection site Hives or difficulty breathing (go to the emergency room) Inflammation or drainage at the infection site Severe back/neck pain Any new symptoms which are concerning to you  Please note:  Although the local anesthetic injected can often make your back or neck feel good for several hours after the injection, the pain will likely return.  It takes 3-7 days for steroids to work in the epidural space.  You may not notice any pain relief for at least that one week.  If effective, we will often do a series of three injections spaced 3-6 weeks apart to maximally decrease your pain.  After the initial series, we generally will wait several months before considering a repeat injection of the same type.  If you have any questions, please call [phone] [facility]  Reasons to call and reschedule or cancel your procedure: (Following these recommendations will minimize the risk of a serious complication.) Surgeries: Avoid having procedures within 2 weeks of any surgery. (Avoid for 2 weeks before or after any surgery). Flu Shots: Avoid having procedures within 2 weeks of a flu shots or . (Avoid for  2 weeks before or after immunizations). Barium: Avoid having a procedure within 7-10 days after having had a radiological study involving the use of radiological contrast. (Myelograms, Barium swallow or enema study). Heart attacks: Avoid any elective procedures or surgeries for the initial 6 months after a "Myocardial Infarction" (Heart Attack). Blood thinners: It is imperative that you stop these medications before procedures. Let us know  if you if you take any blood thinner.  Infection: Avoid procedures during or within two weeks of an infection (including chest colds or gastrointestinal problems). Symptoms associated with infections include: Localized redness, fever, chills, night sweats or profuse sweating, burning sensation when voiding, cough, congestion, stuffiness, runny nose, sore throat, diarrhea, nausea, vomiting, cold or Flu symptoms, recent or current infections. It is specially important if the infection is over the area that we intend to treat. Heart and lung problems: Symptoms that may suggest an active cardiopulmonary problem include: cough, chest pain, breathing difficulties or shortness of breath, dizziness, ankle swelling, uncontrolled high or unusually low blood pressure, and/or palpitations. If you are experiencing any of these symptoms, cancel your procedure and contact your primary care physician for an evaluation.  Remember:  Regular Business hours are:  Monday to Thursday 8:00 AM to 4:00 PM  Provider's Schedule: Delano Metz, MD:  Procedure days: Tuesday and Thursday 7:30 AM to 4:00 PM  Edward Jolly, MD:  Procedure days: Monday and Wednesday 7:30 AM to 4:00 PM

## 2023-08-26 ENCOUNTER — Telehealth: Payer: Self-pay

## 2023-08-26 NOTE — Telephone Encounter (Signed)
I called his insurance company, its a [facility] plan that I have never heard of, and they told me that no kind of injections are covered under his plan. I called the patient and let him know.

## 2023-09-09 NOTE — Telephone Encounter (Signed)
Completed.

## 2023-09-09 NOTE — Telephone Encounter (Signed)
Please contact him to reschedule his appointment.

## 2023-09-15 ENCOUNTER — Ambulatory Visit: Payer: No Typology Code available for payment source | Admitting: Orthopedic Surgery

## 2023-09-29 ENCOUNTER — Encounter: Payer: Self-pay | Admitting: Diagnostic Neuroimaging

## 2023-09-29 ENCOUNTER — Ambulatory Visit (INDEPENDENT_AMBULATORY_CARE_PROVIDER_SITE_OTHER): Payer: No Typology Code available for payment source | Admitting: Diagnostic Neuroimaging

## 2023-09-29 VITALS — BP 120/78 | HR 79 | Ht 74.0 in | Wt 261.0 lb

## 2023-09-29 DIAGNOSIS — G459 Transient cerebral ischemic attack, unspecified: Secondary | ICD-10-CM | POA: Diagnosis not present

## 2023-09-29 MED ORDER — CLOPIDOGREL BISULFATE 75 MG PO TABS: 75.0000 mg | ORAL_TABLET | Freq: Every day | ORAL | 1 refills | Status: AC

## 2023-09-29 NOTE — Progress Notes (Signed)
[facility]  PATIENT: Lee Reynolds DOB: 04-Mar-1971  REFERRING CLINICIAN: Marvel Plan, MD HISTORY FROM: patient  REASON FOR VISIT: new consult   HISTORICAL  CHIEF COMPLAINT:  Chief Complaint  Patient presents with   New Problem     RM 7, here alone  Pt is here for hospital follow up after TIA. Pt states he is doing well. States symptoms have resolved. States he needs a refill on his Plavix.     HISTORY OF PRESENT ILLNESS:   52 year old male here for evaluation of TIA.  History of hyperlipidemia, asthma, gout.  08/01/2023 patient woke up and then noticed left arm and left leg numbness, tingling, weakness.  Went to the hospital for evaluation.  Symptoms lasted about 1 hour and then resolved.  Stroke workup was completed.  Patient was treated medically.  Since that time patient is doing well.  No recurrent symptoms.  Does have some low back pain and spinal stenosis issues, managed by spine surgery clinic.   REVIEW OF SYSTEMS: Full 14 system review of systems performed and negative with exception of: as per HPI.  ALLERGIES: Allergies  Allergen Reactions   Protonix [Pantoprazole] Itching    HOME MEDICATIONS: Outpatient Medications Prior to Visit  Medication Sig Dispense Refill   Albuterol Sulfate 108 (90 Base) MCG/ACT AEPB Inhale 1 puff into the lungs as needed.     allopurinol (ZYLOPRIM) 300 MG tablet TAKE 1 TABLET BY MOUTH ONCE DAILY FOR 90 DAYS  1   atorvastatin (LIPITOR) 80 MG tablet Take 80 mg by mouth daily.     cyclobenzaprine (FLEXERIL) 10 MG tablet Take 1 tablet by mouth 3 (three) times daily as needed.     famotidine (PEPCID) 40 MG tablet Take 1 tablet by mouth at bedtime. PRN     fluticasone (FLONASE) 50 MCG/ACT nasal spray Place 1 spray into both nostrils daily. PRN     gabapentin (NEURONTIN) 300 MG capsule Take 1 capsule (300 mg total) by mouth 2 (two) times daily. 60 capsule 2   ibuprofen (ADVIL) 200 MG tablet Take 200 mg by mouth every 6  (six) hours as needed.     indomethacin (INDOCIN) 25 MG capsule Take 1 capsule by mouth 2 (two) times daily. PRN     loratadine (CLARITIN) 10 MG tablet Take 10 mg by mouth daily.     ZEPBOUND 2.5 MG/0.5ML Pen Inject 2.5 mg into the skin once a week.     clopidogrel (PLAVIX) 75 MG tablet Take 1 tablet (75 mg total) by mouth daily. 30 tablet 1   aspirin EC 81 MG tablet Take 81 mg by mouth daily. Swallow whole.     No facility-administered medications prior to visit.      PHYSICAL EXAM  GENERAL EXAM/CONSTITUTIONAL: Vitals:  Vitals:   09/29/23 1004  BP: 120/78  Pulse: 79  Weight: 261 lb (118.4 kg)  Height: 6\' 2"  (1.88 m)   Body mass index is 33.51 kg/m. Wt Readings from Last 3 Encounters:  09/29/23 261 lb (118.4 kg)  08/25/23 269 lb (122 kg)  08/01/23 269 lb (122 kg)   Patient is in no distress; well developed, nourished and groomed; neck is supple  CARDIOVASCULAR: Examination of carotid arteries is normal; no carotid bruits Regular rate and rhythm, no murmurs Examination of peripheral vascular system by observation and palpation is normal  EYES: Ophthalmoscopic exam of optic discs and posterior segments is normal; no papilledema or hemorrhages No results found.  MUSCULOSKELETAL: Gait, strength, tone, movements noted  in Neurologic exam below  NEUROLOGIC: MENTAL STATUS:      No data to display         awake, alert, oriented to person, place and time recent and remote memory intact normal attention and concentration language fluent, comprehension intact, naming intact fund of knowledge appropriate  CRANIAL NERVE:  2nd - no papilledema on fundoscopic exam 2nd, 3rd, 4th, 6th - pupils equal and reactive to light, visual fields full to confrontation, extraocular muscles intact, no nystagmus 5th - facial sensation symmetric 7th - facial strength symmetric 8th - hearing intact 9th - palate elevates symmetrically, uvula midline 11th - shoulder shrug symmetric 12th  - tongue protrusion midline  MOTOR:  normal bulk and tone, full strength in the BUE, BLE  SENSORY:  normal and symmetric to light touch, temperature, vibration  COORDINATION:  finger-nose-finger, fine finger movements normal  REFLEXES:  deep tendon reflexes TRACE and symmetric  GAIT/STATION:  narrow based gait     DIAGNOSTIC DATA (LABS, IMAGING, TESTING) - I reviewed patient records, labs, notes, testing and imaging myself where available.  Lab Results  Component Value Date   WBC 4.1 08/01/2023   HGB 13.6 08/01/2023   HCT 41.6 08/01/2023   MCV 85.4 08/01/2023   PLT 189 08/01/2023      Component Value Date/Time   NA 137 08/02/2023 0239   NA 139 07/09/2021 1026   K 3.8 08/02/2023 0239   CL 105 08/02/2023 0239   CO2 23 08/02/2023 0239   GLUCOSE 84 08/02/2023 0239   BUN 15 08/02/2023 0239   BUN 11 07/09/2021 1026   CREATININE 1.26 (H) 08/02/2023 0239   CALCIUM 8.8 (L) 08/02/2023 0239   PROT 6.4 (L) 08/02/2023 0239   ALBUMIN 3.2 (L) 08/02/2023 0239   AST 55 (H) 08/02/2023 0239   ALT 50 (H) 08/02/2023 0239   ALKPHOS 54 08/02/2023 0239   BILITOT 0.8 08/02/2023 0239   GFRNONAA >60 08/02/2023 0239   GFRAA  10/10/2008 0925    >60        The eGFR has been calculated using the MDRD equation. This calculation has not been validated in all clinical   Lab Results  Component Value Date   CHOL 117 08/02/2023   HDL 29 (L) 08/02/2023   LDLCALC 76 08/02/2023   TRIG 59 08/02/2023   CHOLHDL 4.0 08/02/2023   Lab Results  Component Value Date   HGBA1C 5.9 (H) 08/01/2023   No results found for: "VITAMINB12" No results found for: "TSH"  08/01/23 MRI brain [I reviewed images myself and agree with interpretation. -VRP]  Normal brain MRI for age. No acute intracranial infarct or other abnormality.  08/01/23 CTA head / neck 1. CTA is negative for large vessel occlusion. Mild for age atherosclerosis in the head and neck with no arterial stenosis. 2. Normal for age CT  appearance of the brain. 3. 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   ASSESSMENT AND PLAN  52 y.o. year old male here with:   Dx:  1. TIA (transient ischemic attack)       PLAN:  52  year old male with history of hyperlipidemia, OSA, gout, obesity on Mounjaro, lower back pain admitted for left-sided (arm and leg) weakness and tingling for 1 hour (08/01/23). No tPA given due to symptom resolved.     Possible right brain TIA Subjective left-sided weakness and tingling lasting 1 hour.  Denies headache CT no acute abnormality CTA  head and neck unremarkable MRI no acute infarct 2D Echo EF 60-65% LDL 76 HgbA1c 5.9 UDS negative Lovenox for VTE prophylaxis aspirin 81 mg daily prior to admission, completed aspirin 81 mg daily and clopidogrel 75 mg daily DAPT for 3 weeks; now continue clopidogrel 75mg  daily long term.   BP measurement Stable BP monitoring at home Long term BP goal normotensive   Hyperlipidemia Mildly elevated LFTs Home meds: Lipitor 80 LDL 76, goal < 70 AST/ALT 40/10--27/25 continue atorvastatin 80 Follow-up with PCP closely to monitor LFTs   Other Stroke Risk Factors Former cigar user Obesity, Body mass index is 34.54 kg/m.  On Mounjaro recently, lost 36 pounds  Meds ordered this encounter  Medications   clopidogrel (PLAVIX) 75 MG tablet    Sig: Take 1 tablet (75 mg total) by mouth daily.    Dispense:  90 tablet    Refill:  1    Return for return to PCP, pending if symptoms worsen or fail to improve.    Suanne Marker, MD 09/29/2023, 10:25 AM Certified in Neurology, Neurophysiology and Neuroimaging  [facility] [address], [address] [place], [place] [address] [phone]

## 2023-10-02 NOTE — Progress Notes (Deleted)
Referring Physician:  [name], MD [address] [place],  [place] [address]  Primary Physician:  [name], MD  History of Present Illness: 10/02/2023 Lee Reynolds has a history of CAD, asthma, GERD, hyperlipidemia, and obesity.    History of lumbar surgery in 2009- L4-L5 decompression.  Last seen by me on 07/24/23 for a phone visit to review his lumbar MRI. left foraminal disc at L2-L3, but he has no groin pain. He has multilevel foraminal stenosis with DDD L3-S1.    LBP is likely multifactorial and due to underlying DDD/spondylosis. Left leg pain may be due to foraminal stenosis L5-S1. Not sure of cause of left foot numbness.  He declined PT. He saw Dr. Cherylann Ratel and they discussed lumbar ESI. This has not been scheduled. He was started on neurontin.   He was seen in ED on 08/01/23 and diagnosed with TIA. He is on PLAVIX.        He has known  6 month history of constant LBP with occasional left posterior leg pain to his foot. He has more constant numbness in left foot x 6 months. No weakness in left leg. No right leg pain. Pain is worse with laying down and with activity. He has some relief with flexeril.   Bowel/Bladder Dysfunction: none  Conservative measures:  Physical therapy: none recently Multimodal medical therapy including regular antiinflammatories: flexeril, indocin, motrin Injections: no epidural steroid injections  Past Surgery:  Lumbar surgery in 2009 L4-L5 decompression by Dr. Tomasa Blase has no symptoms of cervical myelopathy.  The symptoms are causing a significant impact on the patient's life.   Review of Systems:  A 10 point review of systems is negative, except for the pertinent positives and negatives detailed in the HPI.  Past Medical History: Past Medical History:  Diagnosis Date   Asthma 10/11/2018   GERD (gastroesophageal reflux disease) 10/11/2018   Gout 10/11/2018   Headache     Hypercholesterolemia 10/11/2018   Hyperlipemia    Right hand pain 06/24/2018   Trigger finger of right thumb 06/24/2018    Past Surgical History: Past Surgical History:  Procedure Laterality Date   BACK SURGERY  10/2008   L4,L5 discectomy    Allergies: Allergies as of 10/05/2023 - Review Complete 09/29/2023  Allergen Reaction Noted   Protonix [pantoprazole] Itching 04/03/2020    Medications: Outpatient Encounter Medications as of 10/05/2023  Medication Sig   Albuterol Sulfate 108 (90 Base) MCG/ACT AEPB Inhale 1 puff into the lungs as needed.   allopurinol (ZYLOPRIM) 300 MG tablet TAKE 1 TABLET BY MOUTH ONCE DAILY FOR 90 DAYS   atorvastatin (LIPITOR) 80 MG tablet Take 80 mg by mouth daily.   clopidogrel (PLAVIX) 75 MG tablet Take 1 tablet (75 mg total) by mouth daily.   cyclobenzaprine (FLEXERIL) 10 MG tablet Take 1 tablet by mouth 3 (three) times daily as needed.   famotidine (PEPCID) 40 MG tablet Take 1 tablet by mouth at bedtime. PRN   fluticasone (FLONASE) 50 MCG/ACT nasal spray Place 1 spray into both nostrils daily. PRN   gabapentin (NEURONTIN) 300 MG capsule Take 1 capsule (300 mg total) by mouth 2 (two) times daily.   ibuprofen (ADVIL) 200 MG tablet Take 200 mg by mouth every 6 (six) hours as needed.   indomethacin (INDOCIN) 25 MG capsule Take 1 capsule by mouth 2 (two) times daily. PRN   loratadine (CLARITIN) 10 MG tablet Take 10 mg by mouth daily.   ZEPBOUND 2.5  MG/0.5ML Pen Inject 2.5 mg into the skin once a week.   No facility-administered encounter medications on file as of 10/05/2023.    Social History: Social History   Tobacco Use   Smoking status: Former    Types: Cigars    Quit date: 2021    Years since quitting: 3.8   Smokeless tobacco: Never   Tobacco comments:    occasionally  Vaping Use   Vaping status: Never Used  Substance Use Topics   Alcohol use: Never   Drug use: Never    Family Medical History: Family History  Problem Relation Age of  Onset   Hyperlipidemia Mother    Cancer Father    Heart disease Maternal Grandmother     Physical Examination: There were no vitals filed for this visit.    Awake, alert, oriented to person, place, and time.  Speech is clear and fluent. Fund of knowledge is appropriate.   Cranial Nerves: Pupils equal round and reactive to light.  Facial tone is symmetric.    No posterior lumbar tenderness. Well healed lumbar incision.   No abnormal lesions on exposed skin.   Strength: Side Biceps Triceps Deltoid Interossei Grip Wrist Ext. Wrist Flex.  R 5 5 5 5 5 5 5   L 5 5 5 5 5 5 5    Side Iliopsoas Quads Hamstring PF DF EHL  R 5 5 5 5 5 5   L 5 5 5 5 5 5    Reflexes are 2+ and symmetric at the biceps, brachioradialis, patella and achilles.   Hoffman's is absent.  Clonus is not present.   Bilateral upper and lower extremity sensation is intact to light touch. He notes decreased sensation in left foot compared to right.   Gait is normal.     Medical Decision Making  Imaging: None  Assessment and Plan: Lee Reynolds is a pleasant 52 y.o. male has history of lumbar surgery in 2009- L4-L5 decompression.  His LBP is now intermittent (was constant). He also has intermittent left posterior leg pain to his foot (has daily). He has more constant numbness in left foot. No weakness. No right leg pain. Pain is worse with activity.    He has left foraminal disc at L2-L3, but he has no groin pain. He has multilevel foraminal stenosis with DDD L3-S1.    LBP is likely multifactorial and due to underlying DDD/spondylosis. Left leg pain may be due to foraminal stenosis L5-S1. Not sure of cause of left foot numbness.    Treatment options discussed with patient and following plan made:    - Recommend PT for lumbar spine. He declines. Will send lumbar HEP to do on his own.  - Referral to pain management (Late[facility]) to consider lumbar injections.  - Okay to continue on prn flexeril from other providers. Reviewed  dosing and side effects. Discussed this can cause drowsiness.  - Will follow numbness in left foot. If no improvement with above, may consider EMG.  - Follow up with me in 6-8 weeks and prn.   [name] PA-C Dept. of Neurosurgery

## 2023-10-05 ENCOUNTER — Ambulatory Visit: Payer: No Typology Code available for payment source | Admitting: Orthopedic Surgery

## 2024-04-15 ENCOUNTER — Encounter (HOSPITAL_COMMUNITY): Payer: Self-pay

## 2024-04-15 ENCOUNTER — Ambulatory Visit (HOSPITAL_COMMUNITY)
Admission: EM | Admit: 2024-04-15 | Discharge: 2024-04-15 | Disposition: A | Discharge status: Home / Self Care | Attending: Physician Assistant | Admitting: Physician Assistant

## 2024-04-15 DIAGNOSIS — J0140 Acute pansinusitis, unspecified: Secondary | ICD-10-CM | POA: Diagnosis not present

## 2024-04-15 LAB — POC COVID19/FLU A&B COMBO
Covid Antigen, POC: NEGATIVE
Influenza A Antigen, POC: NEGATIVE
Influenza B Antigen, POC: NEGATIVE

## 2024-04-15 MED ORDER — AMOXICILLIN-POT CLAVULANATE 875-125 MG PO TABS
1.0000 | ORAL_TABLET | Freq: Two times a day (BID) | ORAL | 0 refills | Status: AC
Start: 2024-04-15 — End: ?

## 2024-04-15 NOTE — Discharge Instructions (Signed)
 You tested negative for COVID and flu.  I suspect you have a different virus versus allergies.  Continue the over-the-counter allergy medication as well as nasal saline and sinus rinses.  If your symptoms are not improving by next week or if anything suddenly worsens it is reasonable to start an antibiotic but I would wait until that time.  I have called in Augmentin and would like you to start this if you are not improving with over-the-counter medications after about a week.  If anything worsens and you have high fever, worsening cough, shortness of breath, chest pain you need to be seen immediately.

## 2024-04-15 NOTE — ED Triage Notes (Signed)
 Pt states congestion,runny nose,sore throat for the past 3 days.  States he has been taking Flonase at home with no relief.

## 2024-04-15 NOTE — ED Provider Notes (Signed)
 [facility]    CSN: [id number] Arrival date & time: 04/15/24  6578      History   Chief Complaint Chief Complaint  Patient presents with   Nasal Congestion    HPI Lee Reynolds is a 53 y.o. male.   Patient presents today with a 3-day history of URI symptoms including rhinorrhea, congestion, sore throat.  Reports associated mild cough but denies any chest pain, shortness of breath, fever, nausea, vomiting, body aches, headache.  He has been taking fluticasone nasal spray and Claritin without improvement of symptoms.  Denies any known sick contacts but does work around children.  He denies history of asthma that this is listed on his chart; he does have an albuterol inhaler available but has not required this in many years.  Denies hospitalization related to asthma.  He denies any recent antibiotics or steroids.  He has not tried additional over-the-counter medication outside of what has previously been recommended for seasonal allergies.  He has not had COVID but has had COVID-19 vaccines.    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   Chronic pain syndrome 08/25/2023   TIA (transient ischemic attack) 08/01/2023   Leukopenia 08/01/2023   Elevated blood pressure reading 08/01/2023   CKD (chronic kidney disease) stage 2, GFR 60-89 ml/min 08/01/2023   Transaminitis 08/01/2023   Prediabetes 08/01/2023   Obesity (BMI 30-39.9) 08/01/2023   Asthma 10/11/2018   Gout 10/11/2018   GERD (gastroesophageal reflux disease) 10/11/2018    Hypercholesterolemia 10/11/2018   Chest pain 10/11/2018   Morbid obesity (HCC) 10/11/2018   Right hand pain 06/24/2018   Trigger finger of right thumb 06/24/2018    Past Surgical History:  Procedure Laterality Date   BACK SURGERY  10/2008   L4,L5 discectomy       Home Medications    Prior to Admission medications   Medication Sig Start Date End Date Taking? Authorizing Provider  amoxicillin-clavulanate (AUGMENTIN) 875-125 MG tablet Take 1 tablet by mouth every 12 (twelve) hours. 04/15/24  Yes Bernardette Waldron K, PA-C  Albuterol Sulfate 108 (90 Base) MCG/ACT AEPB Inhale 1 puff into the lungs as needed.    [provider]  allopurinol (ZYLOPRIM) 300 MG tablet TAKE 1 TABLET BY MOUTH ONCE DAILY FOR 90 DAYS 07/15/18   [provider]  atorvastatin  (LIPITOR) 80 MG tablet Take 80 mg by mouth daily.    [provider]  clopidogrel  (PLAVIX ) 75 MG tablet Take 1 tablet (75 mg total) by mouth daily. 09/29/23   Penumalli, Vikram R, MD  cyclobenzaprine  (FLEXERIL ) 10 MG tablet Take 1 tablet by mouth 3 (three) times daily as needed.    [provider]  famotidine (PEPCID) 40 MG tablet Take 1 tablet by mouth at bedtime. PRN    [provider]  fluticasone (FLONASE) 50 MCG/ACT nasal spray Place 1 spray into both nostrils daily. PRN    [provider]  gabapentin  (NEURONTIN ) 300 MG capsule Take 1 capsule (300 mg total) by mouth 2 (two) times daily. 08/25/23 11/23/23  Cephus Collin, MD  ibuprofen  (ADVIL ) 200 MG tablet Take 200 mg by mouth every 6 (six) hours as needed.    [provider]  indomethacin (INDOCIN) 25 MG capsule Take 1 capsule by mouth 2 (two) times daily. PRN    [provider]  loratadine (CLARITIN) 10 MG tablet Take 10 mg by mouth daily.    [provider]  ZEPBOUND 2.5 MG/0.5ML Pen Inject 10 mg into the skin once a week. 05/27/23   [provider]    Family History Family History  Problem Relation Age  of Onset   Hyperlipidemia Mother    Cancer Father    Heart disease Maternal Grandmother     Social History Social History   Tobacco Use   Smoking status: Former    Types: Cigars    Quit date: 2021    Years since quitting: 4.3   Smokeless tobacco: Never   Tobacco comments:    occasionally  Vaping Use   Vaping status: Never Used  Substance Use Topics   Alcohol use: Never   Drug use: Never     Allergies   Protonix [pantoprazole]   Review of Systems Review of Systems  Constitutional:  Positive for activity change. Negative for appetite change, fatigue and fever.  HENT:  Positive for congestion, postnasal drip, sinus pressure and sore throat. Negative for sneezing.   Respiratory:  Positive for cough. Negative for shortness of breath.   Cardiovascular:  Negative for chest pain.  Gastrointestinal:  Negative for abdominal pain, diarrhea, nausea and vomiting.  Musculoskeletal:  Negative for arthralgias and myalgias.  Neurological:  Negative for dizziness, light-headedness and headaches.     Physical Exam Triage Vital Signs ED Triage Vitals  Encounter Vitals Group     BP 04/15/24 0852 124/81     Systolic BP Percentile --      Diastolic BP Percentile --      Pulse Rate 04/15/24 0852 71     Resp 04/15/24 0852 16     Temp 04/15/24 0852 98.3 F (36.8 C)     Temp Source 04/15/24 0852 Oral     SpO2 04/15/24 0852 97 %     Weight --      Height --      Head Circumference --      Peak Flow --      Pain Score 04/15/24 0853 0     Pain Loc --      Pain Education --      Exclude from Growth Chart --    No data found.  Updated Vital Signs BP 124/81 (BP Location: Right Arm)   Pulse 71   Temp 98.3 F (36.8 C) (Oral)   Resp 16   SpO2 97%   Visual Acuity Right Eye Distance:   Left Eye Distance:   Bilateral Distance:    Right Eye Near:   Left Eye Near:    Bilateral Near:     Physical Exam Vitals reviewed.  Constitutional:      General: He is awake.      Appearance: Normal appearance. He is well-developed. He is not ill-appearing.     Comments: Very pleasant male appears stated age in no acute distress sitting comfortably in exam room  HENT:     Head: Normocephalic and atraumatic.     Right Ear: Tympanic membrane, ear canal and external ear normal. Tympanic membrane is not erythematous or bulging.     Left Ear: Tympanic membrane, ear canal and external ear normal. Tympanic membrane is  not erythematous or bulging.     Nose: Nose normal.     Mouth/Throat:     Pharynx: Uvula midline. Posterior oropharyngeal erythema and postnasal drip present. No oropharyngeal exudate or uvula swelling.  Cardiovascular:     Rate and Rhythm: Normal rate and regular rhythm.     Heart sounds: Normal heart sounds, S1 normal and S2 normal. No murmur heard. Pulmonary:     Effort: Pulmonary effort is normal. No accessory muscle usage or respiratory distress.     Breath sounds: Normal breath sounds. No stridor. No wheezing, rhonchi or rales.     Comments: Clear to auscultation bilaterally Neurological:     Mental Status: He is alert.  Psychiatric:        Behavior: Behavior is cooperative.      UC Treatments / Results  Labs (all labs ordered are listed, but only abnormal results are displayed) Labs Reviewed  POC COVID19/FLU A&B COMBO    EKG   Radiology No results found.  Procedures Procedures (including critical care time)  Medications Ordered in UC Medications - No data to display  Initial Impression / Assessment and Plan / UC Course  I have reviewed the triage vital signs and the nursing notes.  Pertinent labs & imaging results that were available during my care of the patient were reviewed by me and considered in my medical decision making (see chart for details).     Patient is well-appearing, afebrile, nontoxic, nontachycardic.  Vital signs and physical exam are reassuring with no indication for emergent evaluation or imaging.  Viral  testing was negative in clinic.  We discussed symptoms are most likely viral versus seasonal allergies, however, patient reports that symptoms are consistent with previous sinus infections that have required antibiotic treatment.  I explained that I do not believe he requires antibiotics at this time but he was concerned about having to return if his symptoms are not improving.  He was provided a prescription for Augmentin and we discussed that if his symptoms are not improving within a week or if he has any significant worsening of symptoms it is reasonable to start this medication but would hold off on starting it for the time being.  He is to continue over-the-counter medications as well as nasal saline and sinus rinses.  We discussed that if he has any worsening symptoms he needs to be seen immediately.  Strict return precautions given.  Work excuse note provided.  Final Clinical Impressions(s) / UC Diagnoses   Final diagnoses:  Acute non-recurrent pansinusitis     Discharge Instructions      You tested negative for COVID and flu.  I suspect you have a different virus versus allergies.  Continue the over-the-counter allergy medication as well as nasal saline and sinus rinses.  If your symptoms are not improving by next week or if anything suddenly worsens it is reasonable to start an antibiotic but I would wait until that time.  I have called in Augmentin and would like you to start this if you are not improving with over-the-counter medications after about a week.  If anything worsens and you have high fever, worsening cough, shortness of breath, chest pain you need to be seen immediately.   ED Prescriptions     Medication Sig Dispense Auth. Provider   amoxicillin-clavulanate (AUGMENTIN) 875-125 MG tablet Take 1 tablet by mouth every 12 (twelve) hours. 14 tablet Zuria Fosdick K, PA-C      PDMP not reviewed this encounter.   Joie Reamer,  Betsey Brow, PA-C 04/15/24 [phone]

## 2024-04-19 ENCOUNTER — Other Ambulatory Visit: Payer: Self-pay

## 2024-04-19 ENCOUNTER — Other Ambulatory Visit (HOSPITAL_BASED_OUTPATIENT_CLINIC_OR_DEPARTMENT_OTHER): Payer: Self-pay

## 2024-04-19 ENCOUNTER — Encounter (HOSPITAL_BASED_OUTPATIENT_CLINIC_OR_DEPARTMENT_OTHER): Payer: Self-pay | Admitting: Emergency Medicine

## 2024-04-19 ENCOUNTER — Emergency Department (HOSPITAL_BASED_OUTPATIENT_CLINIC_OR_DEPARTMENT_OTHER)
Admission: EM | Admit: 2024-04-19 | Discharge: 2024-04-19 | Disposition: A | Discharge status: Home / Self Care | Attending: Emergency Medicine | Admitting: Emergency Medicine

## 2024-04-19 DIAGNOSIS — Z7902 Long term (current) use of antithrombotics/antiplatelets: Secondary | ICD-10-CM | POA: Insufficient documentation

## 2024-04-19 DIAGNOSIS — M5442 Lumbago with sciatica, left side: Secondary | ICD-10-CM | POA: Insufficient documentation

## 2024-04-19 DIAGNOSIS — M545 Low back pain, unspecified: Secondary | ICD-10-CM | POA: Diagnosis present

## 2024-04-19 DIAGNOSIS — Z8673 Personal history of transient ischemic attack (TIA), and cerebral infarction without residual deficits: Secondary | ICD-10-CM | POA: Diagnosis not present

## 2024-04-19 MED ORDER — KETOROLAC TROMETHAMINE 15 MG/ML IJ SOLN
15.0000 mg | Freq: Once | INTRAMUSCULAR | Status: AC
  Administered 2024-04-19: 15 mg via INTRAMUSCULAR
  Filled 2024-04-19: qty 1

## 2024-04-19 MED ORDER — METHOCARBAMOL 500 MG PO TABS
500.0000 mg | ORAL_TABLET | Freq: Two times a day (BID) | ORAL | 0 refills | Status: AC
  Filled 2024-04-19: qty 20, 10d supply, fill #0

## 2024-04-19 MED ORDER — NAPROXEN 500 MG PO TABS
500.0000 mg | ORAL_TABLET | Freq: Two times a day (BID) | ORAL | 0 refills | Status: AC
  Filled 2024-04-19: qty 30, 15d supply, fill #0

## 2024-04-19 NOTE — ED Notes (Signed)
 Pt given discharge instructions and reviewed prescriptions. Opportunities given for questions. Pt verbalizes understanding. Jillyn Hidden, RN

## 2024-04-19 NOTE — Discharge Instructions (Addendum)
 You were seen today for left-sided low back pain after taking off wrapping from fence.  Suspect that this is muscle strain.  You are provided Toradol today to help with inflammation of the area.  I am also prescribing you another muscle relaxer and anti-inflammatory for you to take.  Anti-inflammatories for extended periods does elevate your risk of bleeding within the stomach/ulcers, recommend you do not take these for extended periods as well as be aware of symptoms of stomach ulcers which include abdominal pain, blood in stool, blood in vomit.  If any of the symptoms are present stop taking and return to the ED. do not take the naproxen with any other NSAID medications which includes indomethacin and ibuprofen .    Please take Robaxin, 500 mg up to twice a day as needed for muscle spasm, this is a muscle relaxer, it may cause generalized weakness, sleepiness and you should not drive or do important things while taking this medication. Please take Naprosyn, 500mg  by mouth twice daily as needed for pain - this in an antiinflammatory medicine (NSAID) and is similar to ibuprofen  - many people feel that it is stronger than ibuprofen  and it is easier to take since it is a smaller pill.  Please use this only for 1 week - if your pain persists, you will need to follow up with your doctor in the office for ongoing guidance and pain control.    You can also take this with Tylenol  if needed. Take Tylenol  (acetominophen)  650mg  every 4-6 hours, as needed for pain or fever. Do not take more than 4,000 mg in a 24-hour period. As this may cause liver damage. While this is rare, if you begin to develop yellowing of the skin or eyes, stop taking and return to ER immediately.  Return to the ED begin to have any new or worsening symptoms including fever, chest pain, shortness of breath, weakness, numbness over genitals, painful urination.

## 2024-04-19 NOTE — ED Triage Notes (Addendum)
 L low back pain since Sunday. Feels like spasms. Hx of back surgery 2009. No known injury. Taking flexeril  at home.

## 2024-04-19 NOTE — ED Provider Notes (Addendum)
 [place] EMERGENCY DEPARTMENT AT [facility] Provider Note   CSN: [id number] Arrival date & time: 04/19/24  6962     History  Chief Complaint  Patient presents with   Back Pain    Lee Reynolds is a 53 y.o. male.   Back Pain Patient is a 53 year old male who presents to the ED today with low back pain x 2 days after leaning over for an extensive period  putting in a fence.  Previous medical history of TIA, currently on Plavix , and status post back surgery 2009.  States that he does have some radiation of pain to the left leg.  However he still been able to ambulate and denies any red flag symptoms: Denies fever, trauma, urinary/fecal incontinence, weakness, saddle paresthesia.  Notes he has felt this way previously when he strained his back approximately 1 year ago.  Also reports intentional 80 pound weight loss over the last couple of years, attempting to lose weight to be healthier.  Also denies fever, chest pain, shortness of breath, abdominal pain, nausea, vomiting, diarrhea, dysuria.     Home Medications Prior to Admission medications   Medication Sig Start Date End Date Taking? Authorizing Provider  methocarbamol (ROBAXIN) 500 MG tablet Take 1 tablet (500 mg total) by mouth 2 (two) times daily. 04/19/24  Yes Hayes Lipps, PA-C  naproxen (NAPROSYN) 500 MG tablet Take 1 tablet (500 mg total) by mouth 2 (two) times daily. 04/19/24  Yes Amedio Bowlby S, PA-C  Albuterol Sulfate 108 (90 Base) MCG/ACT AEPB Inhale 1 puff into the lungs as needed.    [provider]  allopurinol (ZYLOPRIM) 300 MG tablet TAKE 1 TABLET BY MOUTH ONCE DAILY FOR 90 DAYS 07/15/18   [provider]  amoxicillin -clavulanate (AUGMENTIN ) 875-125 MG tablet Take 1 tablet by mouth every 12 (twelve) hours. 04/15/24   Raspet, Erin K, PA-C  atorvastatin  (LIPITOR) 80 MG tablet Take 80 mg by mouth daily.    [provider]  clopidogrel  (PLAVIX ) 75 MG tablet Take 1 tablet (75 mg  total) by mouth daily. 09/29/23   Penumalli, Vikram R, MD  cyclobenzaprine  (FLEXERIL ) 10 MG tablet Take 1 tablet by mouth 3 (three) times daily as needed.    [provider]  famotidine (PEPCID) 40 MG tablet Take 1 tablet by mouth at bedtime. PRN    [provider]  fluticasone (FLONASE) 50 MCG/ACT nasal spray Place 1 spray into both nostrils daily. PRN    [provider]  gabapentin  (NEURONTIN ) 300 MG capsule Take 1 capsule (300 mg total) by mouth 2 (two) times daily. 08/25/23 11/23/23  Cephus Melea Prezioso, MD  ibuprofen  (ADVIL ) 200 MG tablet Take 200 mg by mouth every 6 (six) hours as needed.    [provider]  indomethacin (INDOCIN) 25 MG capsule Take 1 capsule by mouth 2 (two) times daily. PRN    [provider]  loratadine (CLARITIN) 10 MG tablet Take 10 mg by mouth daily.    [provider]  ZEPBOUND 2.5 MG/0.5ML Pen Inject 10 mg into the skin once a week. 05/27/23   [provider]      Allergies    Protonix [pantoprazole]    Review of Systems   Review of Systems  Musculoskeletal:  Positive for back pain.  All other systems reviewed and are negative.   Physical Exam Updated Vital Signs BP 101/70 (BP Location: Left Arm)   Pulse 78   Temp 98.4 F (36.9 C) (Oral)   Resp 16  Ht 6\' 2"  (1.88 m)   Wt 107 kg   SpO2 100%   BMI 30.30 kg/m  Physical Exam Vitals and nursing note reviewed.  Constitutional:      General: He is not in acute distress.    Appearance: Normal appearance. He is not ill-appearing.  HENT:     Head: Normocephalic and atraumatic.  Eyes:     General: No scleral icterus.       Right eye: No discharge.        Left eye: No discharge.     Extraocular Movements: Extraocular movements intact.     Conjunctiva/sclera: Conjunctivae normal.  Cardiovascular:     Rate and Rhythm: Normal rate and regular rhythm.     Pulses: Normal pulses.     Heart sounds: Normal heart sounds. No murmur heard.    No friction  rub. No gallop.  Pulmonary:     Effort: Pulmonary effort is normal. No respiratory distress.     Breath sounds: Normal breath sounds. No stridor. No wheezing, rhonchi or rales.  Abdominal:     General: Abdomen is flat. There is no distension.     Palpations: Abdomen is soft.     Tenderness: There is no abdominal tenderness. There is no right CVA tenderness, left CVA tenderness or guarding.  Musculoskeletal:        General: No deformity or signs of injury.     Cervical back: Normal range of motion and neck supple. No rigidity or tenderness.     Right lower leg: No edema.     Left lower leg: No edema.     Comments: No midline tenderness noted to cervical, thoracic, lumbar spine  Skin:    General: Skin is warm and dry.     Findings: No bruising or erythema.  Neurological:     General: No focal deficit present.     Mental Status: He is alert and oriented to person, place, and time. Mental status is at baseline.     Sensory: No sensory deficit.     Motor: No weakness.     Coordination: Coordination normal.     Comments: Negative straight leg test bilaterally.  Normal flexion, extension at hip, knee, plantarflexion, dorsiflexion 5 out of 5 bilaterally.  Normal sensation bilaterally.  Psychiatric:        Mood and Affect: Mood normal.     ED Results / Procedures / Treatments   Labs (all labs ordered are listed, but only abnormal results are displayed) Labs Reviewed - No data to display  EKG None  Radiology No results found.  Procedures Procedures    Medications Ordered in ED Medications  ketorolac (TORADOL) 15 MG/ML injection 15 mg (has no administration in time range)    ED Course/ Medical Decision Making/ A&P                                 Medical Decision Making Risk Prescription drug management.   This patient is a 53 year old male who presents to the ED for concern of low back pain after putting in a fence 2 days ago, noted to have accompanied with left-sided  sciatica.  No red flag symptoms.  On physical exam, patient is in no acute distress, afebrile, alert and orient x 4, speaking in full sentences, nontachypneic, nontachycardic.  No midline tenderness noted to spine.  No CVA tenderness.  Mild paraspinal muscle tenderness noted over lumbar.  Negative straight  leg test.  Normal strength and sensation bilaterally.  Unremarkable physical exam otherwise.  Suspect low back strain in the presence of no red flag symptoms at this time and reassuring physical exam.  Do not believe imaging or further testing is required at this time.  Patient is requesting Toradol at this time for back pain.  Will provide Toradol shot and sent home with Robaxin and naproxen, having him do light activity and we will have him follow-up with PCP for persistent symptoms.  Patient vital signs have remained stable throughout the course of patient's time in the ED. Low suspicion for any other emergent pathology at this time. I believe this patient is safe to be discharged. Provided strict return to ER precautions. Patient expressed agreement and understanding of plan. All questions were answered.    Differential diagnoses prior to evaluation: The emergent differential diagnosis includes, but is not limited to, back strain, sciatica, cauda equina, tumor, abscess, nephrolithiasis, pyelonephritis, UTI, STI, trauma, fracture, ligament injury, neurovascular injury. This is not an exhaustive differential.   Past Medical History / Co-morbidities / Social History: Asthma, gout, GERD, HLD, chronic headaches, TIA, status post back surgery 2009  Additional history: Chart reviewed. Pertinent results include:   Seen on 04/15/2024 for pansinusitis, using OTC medications at that time and prescribed Augmentin .  Lab Tests/Imaging studies: No labs or imaging were warranted at this time.  Considered baseline labs and CT imaging but do not believe it is warranted as time with patient presentation and  physical exam.     Medications: I ordered medication including Toradol.  I have reviewed the patients home medicines and have made adjustments as needed.   Disposition: After consideration of the diagnostic results and the patients response to treatment, I feel that the patient would benefit from discharge and treatment as above.   emergency department workup does not suggest an emergent condition requiring admission or immediate intervention beyond what has been performed at this time. The plan is: Robaxin, naproxen, light activity, follow-up PCP for persisting symptoms, return to the ED for any new or worsening symptoms. The patient is safe for discharge and has been instructed to return immediately for worsening symptoms, change in symptoms or any other concerns.   Final Clinical Impression(s) / ED Diagnoses Final diagnoses:  Acute bilateral low back pain with left-sided sciatica    Rx / DC Orders ED Discharge Orders          Ordered    methocarbamol (ROBAXIN) 500 MG tablet  2 times daily        04/19/24 0928    naproxen (NAPROSYN) 500 MG tablet  2 times daily        04/19/24 0928              Hayes Lipps, PA-C 04/19/24 0929    Hayes Lipps, PA-C 04/19/24 [id number]    Sueellen Emery, MD 04/19/24 [phone]
